# Patient Record
Sex: Female | Born: 1960 | Race: White | Hispanic: No | Marital: Married | Smoking: Never smoker
Health system: Southern US, Community
[De-identification: ages and names within clinical notes are randomized; demographics above are authoritative.]

## PROBLEM LIST (undated history)

## (undated) DIAGNOSIS — B009 Herpesviral infection, unspecified: Secondary | ICD-10-CM

## (undated) DIAGNOSIS — B379 Candidiasis, unspecified: Secondary | ICD-10-CM

## (undated) DIAGNOSIS — N952 Postmenopausal atrophic vaginitis: Secondary | ICD-10-CM

## (undated) DIAGNOSIS — R112 Nausea with vomiting, unspecified: Secondary | ICD-10-CM

## (undated) DIAGNOSIS — N941 Unspecified dyspareunia: Secondary | ICD-10-CM

## (undated) DIAGNOSIS — A749 Chlamydial infection, unspecified: Secondary | ICD-10-CM

## (undated) DIAGNOSIS — I10 Essential (primary) hypertension: Secondary | ICD-10-CM

## (undated) DIAGNOSIS — R9230 Dense breasts, unspecified: Secondary | ICD-10-CM

## (undated) DIAGNOSIS — A499 Bacterial infection, unspecified: Secondary | ICD-10-CM

## (undated) DIAGNOSIS — Z9889 Other specified postprocedural states: Secondary | ICD-10-CM

## (undated) DIAGNOSIS — N809 Endometriosis, unspecified: Secondary | ICD-10-CM

## (undated) DIAGNOSIS — R922 Inconclusive mammogram: Secondary | ICD-10-CM

## (undated) DIAGNOSIS — Z8619 Personal history of other infectious and parasitic diseases: Secondary | ICD-10-CM

## (undated) HISTORY — DX: Inconclusive mammogram: R92.2

## (undated) HISTORY — DX: Endometriosis, unspecified: N80.9

## (undated) HISTORY — PX: BREAST CYST ASPIRATION: SHX578

## (undated) HISTORY — PX: BREAST EXCISIONAL BIOPSY: SUR124

## (undated) HISTORY — DX: Herpesviral infection, unspecified: B00.9

## (undated) HISTORY — DX: Postmenopausal atrophic vaginitis: N95.2

## (undated) HISTORY — PX: LAPAROSCOPY: SHX197

## (undated) HISTORY — DX: Dense breasts, unspecified: R92.30

## (undated) HISTORY — DX: Candidiasis, unspecified: B37.9

## (undated) HISTORY — DX: Chlamydial infection, unspecified: A74.9

## (undated) HISTORY — PX: MYOMECTOMY: SHX85

## (undated) HISTORY — PX: ABDOMINAL HYSTERECTOMY: SHX81

## (undated) HISTORY — DX: Personal history of other infectious and parasitic diseases: Z86.19

## (undated) HISTORY — PX: VESICOVAGINAL FISTULA CLOSURE W/ TAH: SUR271

## (undated) HISTORY — DX: Essential (primary) hypertension: I10

## (undated) HISTORY — DX: Unspecified dyspareunia: N94.10

## (undated) HISTORY — PX: BREAST BIOPSY: SHX20

## (undated) HISTORY — PX: WISDOM TOOTH EXTRACTION: SHX21

## (undated) HISTORY — DX: Bacterial infection, unspecified: A49.9

## (undated) HISTORY — PX: TONSILECTOMY, ADENOIDECTOMY, BILATERAL MYRINGOTOMY AND TUBES: SHX2538

---

## 2005-04-19 ENCOUNTER — Ambulatory Visit (HOSPITAL_COMMUNITY): Admission: RE | Admit: 2005-04-19 | Discharge: 2005-04-19 | Payer: Self-pay | Admitting: Obstetrics and Gynecology

## 2005-11-28 ENCOUNTER — Encounter: Admission: RE | Admit: 2005-11-28 | Discharge: 2005-11-28 | Payer: Self-pay | Admitting: Obstetrics and Gynecology

## 2006-12-21 ENCOUNTER — Other Ambulatory Visit: Admission: RE | Admit: 2006-12-21 | Discharge: 2006-12-21 | Payer: Self-pay | Admitting: Obstetrics and Gynecology

## 2006-12-25 ENCOUNTER — Encounter: Admission: RE | Admit: 2006-12-25 | Discharge: 2006-12-25 | Payer: Self-pay | Admitting: Obstetrics and Gynecology

## 2006-12-26 ENCOUNTER — Encounter (INDEPENDENT_AMBULATORY_CARE_PROVIDER_SITE_OTHER): Payer: Self-pay | Admitting: Radiology

## 2006-12-26 ENCOUNTER — Encounter: Admission: RE | Admit: 2006-12-26 | Discharge: 2006-12-26 | Payer: Self-pay | Admitting: Obstetrics and Gynecology

## 2007-12-31 ENCOUNTER — Other Ambulatory Visit: Admission: RE | Admit: 2007-12-31 | Discharge: 2007-12-31 | Payer: Self-pay | Admitting: Obstetrics and Gynecology

## 2008-01-03 ENCOUNTER — Encounter: Admission: RE | Admit: 2008-01-03 | Discharge: 2008-01-03 | Payer: Self-pay | Admitting: Obstetrics and Gynecology

## 2008-12-31 ENCOUNTER — Other Ambulatory Visit: Admission: RE | Admit: 2008-12-31 | Discharge: 2008-12-31 | Payer: Self-pay | Admitting: Obstetrics and Gynecology

## 2009-04-09 ENCOUNTER — Encounter: Admission: RE | Admit: 2009-04-09 | Discharge: 2009-04-09 | Payer: Self-pay | Admitting: Obstetrics and Gynecology

## 2010-04-14 ENCOUNTER — Encounter: Admission: RE | Admit: 2010-04-14 | Discharge: 2010-04-14 | Payer: Self-pay | Admitting: Obstetrics and Gynecology

## 2011-03-23 ENCOUNTER — Other Ambulatory Visit: Payer: Self-pay | Admitting: Obstetrics and Gynecology

## 2011-03-23 DIAGNOSIS — Z1231 Encounter for screening mammogram for malignant neoplasm of breast: Secondary | ICD-10-CM

## 2011-04-20 ENCOUNTER — Ambulatory Visit
Admission: RE | Admit: 2011-04-20 | Discharge: 2011-04-20 | Disposition: A | Discharge status: Home / Self Care | Payer: BC Managed Care – PPO | Source: Ambulatory Visit | Attending: Obstetrics and Gynecology | Admitting: Obstetrics and Gynecology

## 2011-04-20 DIAGNOSIS — Z1231 Encounter for screening mammogram for malignant neoplasm of breast: Secondary | ICD-10-CM

## 2011-12-04 ENCOUNTER — Other Ambulatory Visit: Payer: Self-pay | Admitting: Obstetrics and Gynecology

## 2011-12-05 ENCOUNTER — Telehealth: Payer: Self-pay

## 2011-12-05 NOTE — Telephone Encounter (Signed)
Lm on vm to cb per rx request for Premarin received from pharm.

## 2011-12-05 NOTE — Telephone Encounter (Signed)
Tc from pt per telephone call. Pt needs rf for Premarin. Aex sched 02/08/12@2 :00 with [facility]. Rx e-pres to pharm on file. Pt agrees.

## 2012-02-02 DIAGNOSIS — I10 Essential (primary) hypertension: Secondary | ICD-10-CM

## 2012-02-02 DIAGNOSIS — A749 Chlamydial infection, unspecified: Secondary | ICD-10-CM

## 2012-02-02 DIAGNOSIS — A499 Bacterial infection, unspecified: Secondary | ICD-10-CM | POA: Insufficient documentation

## 2012-02-02 DIAGNOSIS — B379 Candidiasis, unspecified: Secondary | ICD-10-CM

## 2012-02-02 DIAGNOSIS — N809 Endometriosis, unspecified: Secondary | ICD-10-CM | POA: Insufficient documentation

## 2012-02-02 DIAGNOSIS — B009 Herpesviral infection, unspecified: Secondary | ICD-10-CM

## 2012-02-08 ENCOUNTER — Ambulatory Visit (INDEPENDENT_AMBULATORY_CARE_PROVIDER_SITE_OTHER): Payer: BC Managed Care – PPO | Admitting: Obstetrics and Gynecology

## 2012-02-08 ENCOUNTER — Encounter: Payer: Self-pay | Admitting: Obstetrics and Gynecology

## 2012-02-08 VITALS — BP 126/70 | Ht 67.25 in | Wt 143.0 lb

## 2012-02-08 DIAGNOSIS — Z01419 Encounter for gynecological examination (general) (routine) without abnormal findings: Secondary | ICD-10-CM

## 2012-02-08 DIAGNOSIS — Z2089 Contact with and (suspected) exposure to other communicable diseases: Secondary | ICD-10-CM

## 2012-02-08 DIAGNOSIS — Z202 Contact with and (suspected) exposure to infections with a predominantly sexual mode of transmission: Secondary | ICD-10-CM

## 2012-02-08 MED ORDER — ESTROGENS CONJUGATED 0.625 MG PO TABS: ORAL_TABLET | ORAL | Status: DC

## 2012-02-08 NOTE — Progress Notes (Signed)
Subjective:  AEX:  Last Pap: 2011 WNL: Yes Regular Periods:no Contraception: Hysterectomy  Monthly Breast exam:yes Tetanus<81yrs:yes Nl.Bladder Function:yes Daily BMs:yes Healthy Diet:yes Calcium:yes Mammogram:yes Date of Mammogram: 04/2011 Exercise:yes Have often Exercise: 3-4 times weekly Seatbelt: yes Abuse at home: no Stressful work:yes Sigmoid-colonoscopy: none Bone Density: No PCP: Dr. Nehemiah Settle Change in PMH: None Change in [facility]: None    Madeline Morris is a 51 y.o. female G1P0010 who presents for annual exam.  The patient has no complaints today. Menopausal symptoms are well managed with Premarin and she wants to continue  The following portions of the patient's history were reviewed and updated as appropriate: allergies, current medications, past family history, past medical history, past social history, past surgical history and problem list.  Review of Systems Pertinent items are noted in HPI. Gastrointestinal:No change in bowel habits, no abdominal pain, no rectal bleeding Genitourinary:negative for dysuria, frequency, hematuria, nocturia and urinary incontinence    Objective:     BP 126/70  Ht 5' 7.25" (1.708 m)  Wt 143 lb (64.864 kg)  BMI 22.23 kg/m2  Weight:  Wt Readings from Last 1 Encounters:  02/08/12 143 lb (64.864 kg)     BMI: Body mass index is 22.23 kg/(m^2). General Appearance: Alert, appropriate appearance for age. No acute distress HEENT: Grossly normal Neck / Thyroid: Supple, no masses, nodes or enlargement Lungs: clear to auscultation bilaterally Back: No CVA tenderness Breast Exam: No masses or nodes.No dimpling, nipple retraction or discharge. Cardiovascular: Regular rate and rhythm. S1, S2, no murmur Gastrointestinal: Soft, non-tender, no masses or organomegaly Pelvic Exam: External genitalia: normal general appearance Vaginal: normal rugae and vaginal vault, well healed and well suspended Cervix: removed surgically Adnexa: non  palpable Uterus: removed surgically Rectovaginal: normal rectal, no masses Lymphatic Exam: Non-palpable nodes in neck, clavicular, axillary, or inguinal regions Skin: no rash or abnormalities Neurologic: Normal gait and speech, no tremor  Psychiatric: Alert and oriented, appropriate affect.    Urinalysis:Not done    Assessment:    Normal gyn exam post-hysterectomy   Plan:   mammogram return annually or prn Hepatitis C as recommended by [facility]

## 2012-03-19 ENCOUNTER — Other Ambulatory Visit: Payer: Self-pay | Admitting: Obstetrics and Gynecology

## 2012-03-19 DIAGNOSIS — Z1231 Encounter for screening mammogram for malignant neoplasm of breast: Secondary | ICD-10-CM

## 2012-04-22 ENCOUNTER — Ambulatory Visit
Admission: RE | Admit: 2012-04-22 | Discharge: 2012-04-22 | Disposition: A | Discharge status: Home / Self Care | Payer: BC Managed Care – PPO | Source: Ambulatory Visit | Attending: Obstetrics and Gynecology | Admitting: Obstetrics and Gynecology

## 2012-04-22 DIAGNOSIS — Z1231 Encounter for screening mammogram for malignant neoplasm of breast: Secondary | ICD-10-CM

## 2012-04-30 ENCOUNTER — Encounter: Payer: Self-pay | Admitting: Obstetrics and Gynecology

## 2012-07-17 ENCOUNTER — Other Ambulatory Visit: Payer: Self-pay | Admitting: Dermatology

## 2012-09-21 ENCOUNTER — Ambulatory Visit (INDEPENDENT_AMBULATORY_CARE_PROVIDER_SITE_OTHER): Payer: BC Managed Care – PPO | Admitting: Physician Assistant

## 2012-09-21 VITALS — BP 174/94 | HR 91 | Temp 99.2°F | Resp 16 | Ht 67.5 in | Wt 141.8 lb

## 2012-09-21 DIAGNOSIS — R509 Fever, unspecified: Secondary | ICD-10-CM

## 2012-09-21 DIAGNOSIS — J029 Acute pharyngitis, unspecified: Secondary | ICD-10-CM

## 2012-09-21 DIAGNOSIS — I10 Essential (primary) hypertension: Secondary | ICD-10-CM

## 2012-09-21 LAB — POCT CBC
Granulocyte percent: 72 %G (ref 37–80)
HCT, POC: 42.4 % (ref 37.7–47.9)
Hemoglobin: 13.5 g/dL (ref 12.2–16.2)
Lymph, poc: 2 (ref 0.6–3.4)
MCH, POC: 29 pg (ref 27–31.2)
MCHC: 31.8 g/dL (ref 31.8–35.4)
MCV: 91.1 fL (ref 80–97)
MID (cbc): 0.6 (ref 0–0.9)
MPV: 8.9 fL (ref 0–99.8)
POC Granulocyte: 6.8 (ref 2–6.9)
POC LYMPH PERCENT: 21.3 %L (ref 10–50)
POC MID %: 6.7 %M (ref 0–12)
Platelet Count, POC: 294 10*3/uL (ref 142–424)
RDW, POC: 12.3 %
WBC: 9.4 10*3/uL (ref 4.6–10.2)

## 2012-09-21 LAB — POCT RAPID STREP A (OFFICE): Rapid Strep A Screen: NEGATIVE

## 2012-09-21 MED ORDER — PENICILLIN V POTASSIUM 500 MG PO TABS: 500.0000 mg | ORAL_TABLET | Freq: Three times a day (TID) | ORAL | Status: DC

## 2012-09-21 MED ORDER — PROMETHAZINE-DM 6.25-15 MG/5ML PO SYRP: 5.0000 mL | ORAL_SOLUTION | Freq: Four times a day (QID) | ORAL | Status: DC | PRN

## 2012-09-21 NOTE — Progress Notes (Signed)
Subjective:    Patient ID: Madeline Morris, female    DOB: Aug 26, 1960, 52 y.o.   MRN: [id number]  HPI 52 yr old CF presents with sore throat for 5 days.  She has also had fever and over the last couple of days she has had cough and hasnt been able to sleep.  No N/V/D.  Cough has not been productive. She went to an urgent care in [place](that's where she was when she got sick) and they did a quick test for strep that was negative. She has taken some ibuprofen which helps temporarily. +body aches. She hasn't taken her BP meds regularly over the last few days. shes never had mono or CMV that she knows of.  Review of Systems  Constitutional: Positive for chills and fatigue.  HENT: Positive for postnasal drip. Negative for congestion, rhinorrhea and sneezing.   Neurological: Positive for headaches.  All other systems reviewed and are negative.       Objective:   Physical Exam  Nursing note and vitals reviewed. Constitutional: She is oriented to person, place, and time. She appears well-developed and well-nourished.  HENT:  Head: Normocephalic and atraumatic.  Right Ear: External ear normal.  Left Ear: External ear normal.  Nose: Nose normal.  Mouth/Throat: No oropharyngeal exudate.  Throat with erythema, tonsils absent  Cardiovascular: Normal rate, regular rhythm and normal heart sounds.   Pulmonary/Chest: Effort normal and breath sounds normal.  Lymphadenopathy:    She has cervical adenopathy (shotty AC node on R, proximally).  Neurological: She is alert and oriented to person, place, and time.  Skin: Skin is warm and dry.  Psychiatric: She has a normal mood and affect. Her behavior is normal.   Results for orders placed in visit on 09/21/12  POCT CBC      Result Value Range   WBC 9.4  4.6 - 10.2 K/uL   Lymph, poc 2.0  0.6 - 3.4   POC LYMPH PERCENT 21.3  10 - 50 %L   MID (cbc) 0.6  0 - 0.9   POC MID % 6.7  0 - 12 %M   POC Granulocyte 6.8  2 - 6.9   Granulocyte percent 72.0  37  - 80 %G   RBC 4.65  4.04 - 5.48 M/uL   Hemoglobin 13.5  12.2 - 16.2 g/dL   HCT, POC 40.9  81.1 - 47.9 %   MCV 91.1  80 - 97 fL   MCH, POC 29.0  27 - 31.2 pg   MCHC 31.8  31.8 - 35.4 g/dL   RDW, POC 91.4     Platelet Count, POC 294  142 - 424 K/uL   MPV 8.9  0 - 99.8 fL  POCT RAPID STREP A (OFFICE)      Result Value Range   Rapid Strep A Screen Negative  Negative        Assessment & Plan:  Pharyngitis and cough at night-suspicious for strep and will cover for strep until culture returns.  Salt  water gargles, ibuprofen, fluids and rest. Meds ordered this encounter  Medications  . penicillin v potassium (VEETID) 500 MG tablet    Sig: Take 1 tablet (500 mg total) by mouth 3 (three) times daily.    Dispense:  30 tablet    Refill:  0    Order Specific Question:  Supervising Provider    Answer:  DOOLITTLE, ROBERT P [[id number]]  . promethazine-dextromethorphan (PROMETHAZINE-DM) 6.25-15 MG/5ML syrup    Sig: Take 5 mLs by  mouth 4 (four) times daily as needed for cough.    Dispense:  118 mL    Refill:  0    Order Specific Question:  Supervising Provider    Answer:  DOOLITTLE, ROBERT P [[id number]]   Hypertension-take meds regularly and check BP OOO.  RTC if it doesn't go back to normal over the next few days.

## 2012-09-21 NOTE — Patient Instructions (Addendum)
Return to clinic if not improving in 3-5 days, sooner if worse.

## 2012-09-23 LAB — CULTURE, GROUP A STREP: Organism ID, Bacteria: NORMAL

## 2013-04-30 ENCOUNTER — Other Ambulatory Visit: Payer: Self-pay

## 2013-04-30 DIAGNOSIS — Z1231 Encounter for screening mammogram for malignant neoplasm of breast: Secondary | ICD-10-CM

## 2013-05-29 ENCOUNTER — Ambulatory Visit
Admission: RE | Admit: 2013-05-29 | Discharge: 2013-05-29 | Disposition: A | Discharge status: Home / Self Care | Payer: Self-pay | Source: Ambulatory Visit

## 2013-05-29 DIAGNOSIS — Z1231 Encounter for screening mammogram for malignant neoplasm of breast: Secondary | ICD-10-CM

## 2013-10-02 ENCOUNTER — Other Ambulatory Visit: Payer: Self-pay | Admitting: Internal Medicine

## 2013-10-02 DIAGNOSIS — R4182 Altered mental status, unspecified: Secondary | ICD-10-CM

## 2013-10-07 ENCOUNTER — Ambulatory Visit
Admission: RE | Admit: 2013-10-07 | Discharge: 2013-10-07 | Disposition: A | Discharge status: Home / Self Care | Payer: BC Managed Care – PPO | Source: Ambulatory Visit | Attending: Internal Medicine | Admitting: Internal Medicine

## 2013-10-07 DIAGNOSIS — R4182 Altered mental status, unspecified: Secondary | ICD-10-CM

## 2014-08-19 ENCOUNTER — Other Ambulatory Visit: Payer: Self-pay

## 2014-08-19 DIAGNOSIS — Z1231 Encounter for screening mammogram for malignant neoplasm of breast: Secondary | ICD-10-CM

## 2014-08-27 ENCOUNTER — Ambulatory Visit
Admission: RE | Admit: 2014-08-27 | Discharge: 2014-08-27 | Disposition: A | Discharge status: Home / Self Care | Payer: BLUE CROSS/BLUE SHIELD | Source: Ambulatory Visit

## 2014-08-27 DIAGNOSIS — Z1231 Encounter for screening mammogram for malignant neoplasm of breast: Secondary | ICD-10-CM

## 2015-07-21 ENCOUNTER — Other Ambulatory Visit: Payer: Self-pay

## 2015-07-21 DIAGNOSIS — Z1231 Encounter for screening mammogram for malignant neoplasm of breast: Secondary | ICD-10-CM

## 2015-08-30 ENCOUNTER — Ambulatory Visit
Admission: RE | Admit: 2015-08-30 | Discharge: 2015-08-30 | Disposition: A | Discharge status: Home / Self Care | Payer: BLUE CROSS/BLUE SHIELD | Source: Ambulatory Visit

## 2015-08-30 DIAGNOSIS — Z1231 Encounter for screening mammogram for malignant neoplasm of breast: Secondary | ICD-10-CM

## 2016-03-29 ENCOUNTER — Ambulatory Visit (INDEPENDENT_AMBULATORY_CARE_PROVIDER_SITE_OTHER): Payer: BLUE CROSS/BLUE SHIELD | Admitting: Podiatry

## 2016-03-29 ENCOUNTER — Ambulatory Visit (INDEPENDENT_AMBULATORY_CARE_PROVIDER_SITE_OTHER): Payer: BLUE CROSS/BLUE SHIELD

## 2016-03-29 ENCOUNTER — Encounter: Payer: Self-pay | Admitting: Podiatry

## 2016-03-29 VITALS — Resp 16 | Ht 67.0 in | Wt 148.0 lb

## 2016-03-29 DIAGNOSIS — M79671 Pain in right foot: Secondary | ICD-10-CM

## 2016-03-29 DIAGNOSIS — M79672 Pain in left foot: Secondary | ICD-10-CM | POA: Diagnosis not present

## 2016-03-29 DIAGNOSIS — M2042 Other hammer toe(s) (acquired), left foot: Secondary | ICD-10-CM | POA: Diagnosis not present

## 2016-03-29 DIAGNOSIS — M21629 Bunionette of unspecified foot: Secondary | ICD-10-CM | POA: Diagnosis not present

## 2016-03-29 DIAGNOSIS — M2041 Other hammer toe(s) (acquired), right foot: Secondary | ICD-10-CM | POA: Diagnosis not present

## 2016-03-29 DIAGNOSIS — M7750 Other enthesopathy of unspecified foot: Secondary | ICD-10-CM | POA: Diagnosis not present

## 2016-03-29 DIAGNOSIS — M2010 Hallux valgus (acquired), unspecified foot: Secondary | ICD-10-CM

## 2016-03-29 MED ORDER — NONFORMULARY OR COMPOUNDED ITEM: 1.0000 g | Freq: Four times a day (QID) | 2 refills | Status: DC

## 2016-03-29 NOTE — Progress Notes (Signed)
   Subjective:    Patient ID: Madeline Morris, female    DOB: 06/11/1961, 55 y.o.   MRN: [id number]  HPI    Review of Systems  All other systems reviewed and are negative.      Objective:   Physical Exam        Assessment & Plan:

## 2016-04-02 NOTE — Progress Notes (Signed)
Patient ID: Madeline Morris, female   DOB: 03-24-1961, 55 y.o.   MRN: [id number] Subjective:  Patient presents today with bilateral foot pain. Patient states that she states is throbbing all over the bottom of her foot this morning when she woke. Patient is a runner and states that she has experimented with forefoot strike running at approximately the same time she experienced pain. Patient presents today for further treatment and evaluation.    Objective/Physical Exam General: The patient is alert and oriented x3 in no acute distress.  Dermatology: Skin is warm, dry and supple bilateral lower extremities. Negative for open lesions or macerations.  Vascular: Palpable pedal pulses bilaterally. No edema or erythema noted. Capillary refill within normal limits.  Neurological: Epicritic and protective threshold grossly intact bilaterally.   Musculoskeletal Exam: Generalized foot pain throughout the bilateral lower extremities likely secondary to forefoot running. Range of motion within normal limits to all pedal and ankle joints bilateral. Muscle strength 5/5 in all groups bilateral.   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 Dorsal spurring noted on lateral x-rays bilaterally.    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 X-rays discussed #2 discussed conservative versus surgical management of all mentioned deformities. At this time the patient opts for conservative management. #3 recommend rest ice compression and elevation to the bilateral feet 4 alleviation and improvement of symptoms. #4  patient is to return to clinic when necessary   Dr. Felecia ShellingBrent M. Lonisha Bobby, DPM [facility]

## 2016-04-11 ENCOUNTER — Ambulatory Visit: Payer: BLUE CROSS/BLUE SHIELD | Admitting: Podiatry

## 2016-05-15 DIAGNOSIS — I10 Essential (primary) hypertension: Secondary | ICD-10-CM | POA: Diagnosis not present

## 2016-06-13 DIAGNOSIS — I10 Essential (primary) hypertension: Secondary | ICD-10-CM | POA: Diagnosis not present

## 2016-07-25 ENCOUNTER — Other Ambulatory Visit: Payer: Self-pay | Admitting: Obstetrics and Gynecology

## 2016-07-25 DIAGNOSIS — Z1231 Encounter for screening mammogram for malignant neoplasm of breast: Secondary | ICD-10-CM

## 2016-08-31 ENCOUNTER — Ambulatory Visit
Admission: RE | Admit: 2016-08-31 | Discharge: 2016-08-31 | Disposition: A | Discharge status: Home / Self Care | Payer: BLUE CROSS/BLUE SHIELD | Source: Ambulatory Visit | Attending: Obstetrics and Gynecology | Admitting: Obstetrics and Gynecology

## 2016-08-31 DIAGNOSIS — Z1231 Encounter for screening mammogram for malignant neoplasm of breast: Secondary | ICD-10-CM

## 2016-09-04 DIAGNOSIS — N952 Postmenopausal atrophic vaginitis: Secondary | ICD-10-CM | POA: Diagnosis not present

## 2016-09-04 DIAGNOSIS — Z01411 Encounter for gynecological examination (general) (routine) with abnormal findings: Secondary | ICD-10-CM | POA: Diagnosis not present

## 2016-09-04 DIAGNOSIS — N941 Unspecified dyspareunia: Secondary | ICD-10-CM | POA: Diagnosis not present

## 2016-09-27 DIAGNOSIS — M6281 Muscle weakness (generalized): Secondary | ICD-10-CM | POA: Diagnosis not present

## 2016-09-27 DIAGNOSIS — N941 Unspecified dyspareunia: Secondary | ICD-10-CM | POA: Diagnosis not present

## 2016-09-27 DIAGNOSIS — M62838 Other muscle spasm: Secondary | ICD-10-CM | POA: Diagnosis not present

## 2016-09-27 DIAGNOSIS — N942 Vaginismus: Secondary | ICD-10-CM | POA: Diagnosis not present

## 2016-10-02 DIAGNOSIS — N942 Vaginismus: Secondary | ICD-10-CM | POA: Diagnosis not present

## 2016-10-02 DIAGNOSIS — M6281 Muscle weakness (generalized): Secondary | ICD-10-CM | POA: Diagnosis not present

## 2016-10-02 DIAGNOSIS — N941 Unspecified dyspareunia: Secondary | ICD-10-CM | POA: Diagnosis not present

## 2016-10-02 DIAGNOSIS — M62838 Other muscle spasm: Secondary | ICD-10-CM | POA: Diagnosis not present

## 2016-11-01 DIAGNOSIS — M6281 Muscle weakness (generalized): Secondary | ICD-10-CM | POA: Diagnosis not present

## 2016-11-01 DIAGNOSIS — N942 Vaginismus: Secondary | ICD-10-CM | POA: Diagnosis not present

## 2016-11-01 DIAGNOSIS — N3946 Mixed incontinence: Secondary | ICD-10-CM | POA: Diagnosis not present

## 2016-11-01 DIAGNOSIS — M62838 Other muscle spasm: Secondary | ICD-10-CM | POA: Diagnosis not present

## 2016-11-15 DIAGNOSIS — N941 Unspecified dyspareunia: Secondary | ICD-10-CM | POA: Diagnosis not present

## 2016-11-15 DIAGNOSIS — I10 Essential (primary) hypertension: Secondary | ICD-10-CM | POA: Diagnosis not present

## 2016-11-15 DIAGNOSIS — N952 Postmenopausal atrophic vaginitis: Secondary | ICD-10-CM | POA: Diagnosis not present

## 2016-11-23 ENCOUNTER — Telehealth: Payer: Self-pay | Admitting: Cardiovascular Disease

## 2016-11-23 NOTE — Telephone Encounter (Signed)
Received Records from [facility] for appointment on 12/19/16 with Dr Duke Salviaandolph.  Records put with Dr Leonides Sakeandolph's schedule for 12/19/16. lp

## 2016-12-19 ENCOUNTER — Ambulatory Visit (INDEPENDENT_AMBULATORY_CARE_PROVIDER_SITE_OTHER): Payer: BLUE CROSS/BLUE SHIELD | Admitting: Cardiovascular Disease

## 2016-12-19 ENCOUNTER — Encounter: Payer: Self-pay | Admitting: Cardiovascular Disease

## 2016-12-19 VITALS — BP 154/86 | HR 61 | Ht 67.0 in | Wt 137.0 lb

## 2016-12-19 DIAGNOSIS — Z79899 Other long term (current) drug therapy: Secondary | ICD-10-CM

## 2016-12-19 DIAGNOSIS — R0789 Other chest pain: Secondary | ICD-10-CM

## 2016-12-19 DIAGNOSIS — E7800 Pure hypercholesterolemia, unspecified: Secondary | ICD-10-CM

## 2016-12-19 DIAGNOSIS — R0609 Other forms of dyspnea: Secondary | ICD-10-CM | POA: Diagnosis not present

## 2016-12-19 MED ORDER — LOSARTAN POTASSIUM 100 MG PO TABS: 100.0000 mg | ORAL_TABLET | Freq: Every day | ORAL | 5 refills | Status: DC

## 2016-12-19 NOTE — Progress Notes (Signed)
Cardiology Office Note   Date:  12/19/2016   ID:  Madeline Morris, DOB 1961/03/06, MRN [id number]  PCP:  System, Pcp Not In  Cardiologist:   Chilton Si, MD   Chief Complaint  Patient presents with  . New Patient (Initial Visit)      History of Present Illness: Madeline Morris is a 56 y.o. female with hypertension who is being seen today for the evaluation of hypertension at the request of Dr. Dierdre Forth.  Madeline Morris Reports that she's had hypertension for at least 25 years. Her blood pressure was elevated for at least 5 years prior to that but she was not started on any medication. Overall she has been feeling well. She runs 2-3 miles most days of the week. She also exercises on a Peloton bike and lifts weights. She doesn't have any chest pain but does feel short of breath with exertion. She feels as though her endurance is not increasing as it should for the amount that she exercises.  She checks her blood pressure at home and it typically is in the 130s to 150s.  Dr. Gaynelle Cage recently started her on losartan, which has helped. Prior to that she also noted increased lower extremity edema. This has been better since adding losartan. However her blood pressure remains poorly-controlled.  She denies orthopnea or PND.  She saw Dr. Pennie Rushing and reported this dyspareunia.  She is interested in starting hormone replacement therapy. She was to cardiology for risk assessment prior to starting this medication and for blood pressure management. Madeline Morris had carotid Dopplers 09/2013 that showed mild bilateral intimal thickening but no plaque.   Past Medical History:  Diagnosis Date  . Atrophy of vagina   . Bacterial infection   . Chlamydia   . Dense breast   . Dyspareunia in female   . Endometriosis   . Herpes   . Herpes simplex   . High blood pressure   . History of chicken pox   . History of measles   . History of mumps   . Hypertensive disorder   . Yeast infection      Past Surgical History:  Procedure Laterality Date  . ABDOMINAL HYSTERECTOMY    . BREAST BIOPSY    . BREAST CYST ASPIRATION    . LAPAROSCOPY    . MYOMECTOMY    . TONSILECTOMY, ADENOIDECTOMY, BILATERAL MYRINGOTOMY AND TUBES    . VESICOVAGINAL FISTULA CLOSURE W/ TAH    . WISDOM TOOTH EXTRACTION       Current Outpatient Prescriptions  Medication Sig Dispense Refill  . Calcium Carb-Cholecalciferol (CALCIUM-VITAMIN D) 500-200 MG-UNIT tablet Take 1 tablet by mouth daily.    . Cholecalciferol (VITAMIN D PO) Take 2,000 Units by mouth daily.     Marland Kitchen losartan (COZAAR) 100 MG tablet Take 1 tablet (100 mg total) by mouth daily. 30 tablet 5  . Multiple Vitamin (MULTIVITAMIN) tablet Take 1 tablet by mouth daily.    . NONFORMULARY OR COMPOUNDED ITEM Apply 1-2 g topically 4 (four) times daily. 120 each 2  . Prasterone (INTRAROSA) 6.5 MG INST Place 1 application vaginally daily.    Marland Kitchen triamterene-hydrochlorothiazide (MAXZIDE-25) 37.5-25 MG tablet Take 1 tablet by mouth daily.      No current facility-administered medications for this visit.     Allergies:   Patient has no known allergies.    Social History:  The patient  reports that she has never smoked. She has never used smokeless tobacco. She reports that  she drinks alcohol. She reports that she does not use drugs.   Family History:  The patient's family history includes Cancer in her father, mother, paternal grandfather, and paternal grandmother; Heart attack in her maternal grandmother; Heart disease in her maternal grandmother; Hypertension in her father and mother.    ROS:  Please see the history of present illness.   Otherwise, review of systems are positive for none.   All other systems are reviewed and negative.    PHYSICAL EXAM: VS:  BP (!) 154/86   Pulse 61   Ht 5\' 7"  (1.702 m)   Wt 62.1 kg (137 lb)   BMI 21.46 kg/m  , BMI Body mass index is 21.46 kg/m. GENERAL:  Well appearing HEENT:  Pupils equal round and reactive,  fundi not visualized, oral mucosa unremarkable NECK:  No jugular venous distention, waveform within normal limits, carotid upstroke brisk and symmetric, no bruits, no thyromegaly LYMPHATICS:  No cervical adenopathy LUNGS:  Clear to auscultation bilaterally HEART:  RRR.  PMI not displaced or sustained,S1 and S2 within normal limits, no S3, no S4, no clicks, no rubs, no murmurs ABD:  Flat, positive bowel sounds normal in frequency in pitch, no bruits, no rebound, no guarding, no midline pulsatile mass, no hepatomegaly, no splenomegaly EXT:  2 plus pulses throughout, no edema, no cyanosis no clubbing SKIN:  No rashes no nodules NEURO:  Cranial nerves II through XII grossly intact, motor grossly intact throughout PSYCH:  Cognitively intact, oriented to person place and time    EKG:  EKG is ordered today. The ekg ordered today demonstrates sinus rhythm. Rate 61 bpm.   Recent Labs: No results found for requested labs within last 8760 hours.   02/11/16: Total cholesterol 243, triglycerides 60, HDL 95, LDL 136 Sodium 139, potassium 3.9, BUN 13, creatinine 0.73 AST 14, ALT 17  Lipid Panel No results found for: CHOL, TRIG, HDL, CHOLHDL, VLDL, LDLCALC, LDLDIRECT    Wt Readings from Last 3 Encounters:  12/19/16 62.1 kg (137 lb)  03/29/16 67.1 kg (148 lb)  09/21/12 64.3 kg (141 lb 12.8 oz)      ASSESSMENT AND PLAN:  # Hypertension:  Madeline Morris's BP remains elevated both here and at home.  We will increase losartan to 100 mg daily.  Continue HCTZ-triamterene.    # Exertional dyspnea:  Madeline Morris is very physically active and exercises regularly.  She doesn't have chest pain but feels that she gets more tired than she should given how frequently she works out.  We will get an ETT to assess for ischemia.   # HRT: Madeline Morris does not have any known CAD and hasn't experienced a PE/DVTor stroke.  Therefore, she should be at acceptable risk for HRT.  Will await results of ETT.   #  Hyperlipidemia: ASCVD 10 year risk 2.9%.  Therefore we will not start a statin or aspirin at this time.    Current medicines are reviewed at length with the patient today.  The patient does not have concerns regarding medicines.  The following changes have been made:  Increase losartan to 100 mg daily  Labs/ tests ordered today include:   Orders Placed This Encounter  Procedures  . Basic metabolic panel  . Exercise Tolerance Test  . EKG 12-Lead     Disposition:   FU with Madeline Morris C. Duke Salviaandolph, MD, [facility] in 2-4 weeks     This note was written with the assistance of speech recognition software.  Please excuse any transcriptional  errors.  Signed, Tyara Dassow C. Duke Salvia, MD, [facility]  12/19/2016 1:30 PM    [facility]

## 2016-12-19 NOTE — Patient Instructions (Signed)
Medication Instructions:  INCREASE YOUR LOSARTAN TO 100 MG DAILY   Labwork: BMET PRIOR TO YOUR FOLLOW UP  Testing/Procedures: Your physician has requested that you have an exercise tolerance test. For further information please visit [email or web address]. Please also follow instruction sheet, as given.  Follow-Up: Your physician recommends that you schedule a follow-up appointment in: 2-4 WEEKS  If you need a refill on your cardiac medications before your next appointment, please call your pharmacy.

## 2017-01-02 ENCOUNTER — Telehealth (HOSPITAL_COMMUNITY): Payer: Self-pay

## 2017-01-02 NOTE — Telephone Encounter (Signed)
Encounter complete. 

## 2017-01-04 ENCOUNTER — Ambulatory Visit (HOSPITAL_COMMUNITY)
Admission: RE | Admit: 2017-01-04 | Discharge: 2017-01-04 | Disposition: A | Discharge status: Home / Self Care | Payer: BLUE CROSS/BLUE SHIELD | Source: Ambulatory Visit | Attending: Cardiology | Admitting: Cardiology

## 2017-01-04 DIAGNOSIS — R0789 Other chest pain: Secondary | ICD-10-CM | POA: Diagnosis not present

## 2017-01-04 DIAGNOSIS — Z79899 Other long term (current) drug therapy: Secondary | ICD-10-CM | POA: Diagnosis not present

## 2017-01-04 LAB — EXERCISE TOLERANCE TEST
CSEPED: 10 min
Estimated workload: 11.7 METS
Exercise duration (sec): 0 s
MPHR: 164 {beats}/min
Peak HR: 184 {beats}/min
Percent HR: 112 %
RPE: 16
Rest HR: 75 {beats}/min

## 2017-01-04 LAB — BASIC METABOLIC PANEL
BUN/Creatinine Ratio: 22 (ref 9–23)
BUN: 16 mg/dL (ref 6–24)
CALCIUM: 10.1 mg/dL (ref 8.7–10.2)
CO2: 22 mmol/L (ref 20–29)
Chloride: 98 mmol/L (ref 96–106)
Creatinine, Ser: 0.74 mg/dL (ref 0.57–1.00)
GFR, EST AFRICAN AMERICAN: 105 mL/min/{1.73_m2} (ref >59)
GFR, EST NON AFRICAN AMERICAN: 91 mL/min/{1.73_m2} (ref >59)
Glucose: 95 mg/dL (ref 65–99)
Potassium: 4.3 mmol/L (ref 3.5–5.2)
Sodium: 136 mmol/L (ref 134–144)

## 2017-01-05 ENCOUNTER — Telehealth: Payer: Self-pay | Admitting: *Deleted

## 2017-01-05 NOTE — Telephone Encounter (Signed)
-----   Message from Yvetta CoderPamela A Phillips sent at 01/05/2017  8:11 AM EDT ----- Regarding: Abnormal ETT Abnormal ETT read by Dr Royann Shiversroitoru on 07.26.18. This patient is a pt of Dr Duke Salviaandolph.

## 2017-01-05 NOTE — Telephone Encounter (Signed)
Reviewed by DOD Dr. Allyson SabalBerry, no orders or recommendations at this time, follow up with Dr. Duke Salviaandolph.    Patient has upcoming appt Wednesday 8/1 with Dr. Duke Salviaandolph.

## 2017-01-08 ENCOUNTER — Telehealth: Payer: Self-pay | Admitting: *Deleted

## 2017-01-08 DIAGNOSIS — R9439 Abnormal result of other cardiovascular function study: Secondary | ICD-10-CM

## 2017-01-08 DIAGNOSIS — R0789 Other chest pain: Secondary | ICD-10-CM

## 2017-01-08 NOTE — Telephone Encounter (Signed)
-----   Message from Chilton Siiffany Sharon, MD sent at 01/08/2017  8:17 AM EDT ----- Stress test was slightly abnormal, but that was probably due to some baseline changes in the EKG.  Lets get a coronary CT-A.

## 2017-01-08 NOTE — Telephone Encounter (Signed)
Advised patient, [facility], and precert

## 2017-01-08 NOTE — Telephone Encounter (Signed)
-----   Message from Chilton Siiffany Sonoita, MD sent at 01/08/2017  8:16 AM EDT ----- Normal kidney function and electrolytes

## 2017-01-10 ENCOUNTER — Ambulatory Visit: Payer: BLUE CROSS/BLUE SHIELD | Admitting: Cardiovascular Disease

## 2017-01-16 ENCOUNTER — Encounter: Payer: Self-pay | Admitting: Cardiovascular Disease

## 2017-01-26 ENCOUNTER — Ambulatory Visit (HOSPITAL_COMMUNITY)
Admission: RE | Admit: 2017-01-26 | Discharge: 2017-01-26 | Disposition: A | Discharge status: Home / Self Care | Payer: BLUE CROSS/BLUE SHIELD | Source: Ambulatory Visit | Attending: Cardiovascular Disease | Admitting: Cardiovascular Disease

## 2017-01-26 ENCOUNTER — Encounter (HOSPITAL_COMMUNITY): Payer: Self-pay

## 2017-01-26 DIAGNOSIS — R0789 Other chest pain: Secondary | ICD-10-CM

## 2017-01-26 DIAGNOSIS — R9439 Abnormal result of other cardiovascular function study: Secondary | ICD-10-CM

## 2017-01-26 DIAGNOSIS — R079 Chest pain, unspecified: Secondary | ICD-10-CM | POA: Diagnosis not present

## 2017-01-26 MED ORDER — NITROGLYCERIN 0.4 MG SL SUBL
0.8000 mg | SUBLINGUAL_TABLET | Freq: Once | SUBLINGUAL | Status: AC
  Administered 2017-01-26: 0.8 mg via SUBLINGUAL

## 2017-01-26 MED ORDER — IOPAMIDOL (ISOVUE-370) INJECTION 76%
80.0000 mL | Freq: Once | INTRAVENOUS | Status: AC | PRN
  Administered 2017-01-26: 80 mL via INTRAVENOUS

## 2017-01-26 MED ORDER — IOPAMIDOL (ISOVUE-370) INJECTION 76%
INTRAVENOUS | Status: AC
  Filled 2017-01-26: qty 100

## 2017-01-26 MED ORDER — NITROGLYCERIN 0.4 MG SL SUBL
SUBLINGUAL_TABLET | SUBLINGUAL | Status: AC
  Filled 2017-01-26: qty 2

## 2017-01-26 MED ORDER — METOPROLOL TARTRATE 5 MG/5ML IV SOLN
INTRAVENOUS | Status: AC
  Administered 2017-01-26: 5 mg
  Filled 2017-01-26: qty 5

## 2017-01-26 MED ORDER — METOPROLOL TARTRATE 5 MG/5ML IV SOLN
INTRAVENOUS | Status: AC
  Filled 2017-01-26: qty 5

## 2017-01-26 MED ORDER — METOPROLOL TARTRATE 5 MG/5ML IV SOLN
5.0000 mg | INTRAVENOUS | Status: DC
  Administered 2017-01-26 (×2): 5 mg via INTRAVENOUS

## 2017-01-31 ENCOUNTER — Telehealth: Payer: Self-pay | Admitting: Cardiovascular Disease

## 2017-01-31 NOTE — Telephone Encounter (Signed)
-----   Message from Chilton Si, MD sent at 01/30/2017  4:26 PM EDT ----- Normal coronary arteries.  No blockages seen.  OK to take hormone replacement therapy.

## 2017-01-31 NOTE — Telephone Encounter (Signed)
Advised patient

## 2017-01-31 NOTE — Telephone Encounter (Signed)
Patient calling, states that she had a "CTA scan" on Friday and would like to know when she might receive results.

## 2017-02-05 DIAGNOSIS — N941 Unspecified dyspareunia: Secondary | ICD-10-CM | POA: Diagnosis not present

## 2017-02-05 DIAGNOSIS — N951 Menopausal and female climacteric states: Secondary | ICD-10-CM | POA: Diagnosis not present

## 2017-02-05 DIAGNOSIS — I10 Essential (primary) hypertension: Secondary | ICD-10-CM | POA: Diagnosis not present

## 2017-02-15 ENCOUNTER — Encounter: Payer: Self-pay | Admitting: Cardiovascular Disease

## 2017-02-15 ENCOUNTER — Ambulatory Visit (INDEPENDENT_AMBULATORY_CARE_PROVIDER_SITE_OTHER): Payer: BLUE CROSS/BLUE SHIELD | Admitting: Cardiovascular Disease

## 2017-02-15 VITALS — BP 130/84 | HR 82 | Ht 67.0 in | Wt 134.0 lb

## 2017-02-15 DIAGNOSIS — E7800 Pure hypercholesterolemia, unspecified: Secondary | ICD-10-CM | POA: Diagnosis not present

## 2017-02-15 DIAGNOSIS — I119 Hypertensive heart disease without heart failure: Secondary | ICD-10-CM

## 2017-02-15 DIAGNOSIS — I10 Essential (primary) hypertension: Secondary | ICD-10-CM

## 2017-02-15 MED ORDER — LOSARTAN POTASSIUM 100 MG PO TABS: 100.0000 mg | ORAL_TABLET | Freq: Every day | ORAL | 3 refills | Status: AC

## 2017-02-15 NOTE — Patient Instructions (Signed)

## 2017-02-15 NOTE — Progress Notes (Signed)
Cardiology Office Note   Date:  02/15/2017   ID:  Madeline Morris, DOB 1961/04/29, MRN [id number]  PCP:  Madeline DillsPolite, Ronald, MD  Cardiologist:   Madeline Siiffany Petersburg, MD   No chief complaint on file.    History of Present Illness: Madeline Morris is a 56 y.o. female with hypertension here for follow-up. She was initially seen 12/2016 for hypertension and risk assessment prior to starting HRT. At that appointment her blood pressure was poorly-controlled, so losartan was increased. She reported some exertional dyspnea and was referred for an ETT. She had some baseline EKG changes that limited the sensitivity and specificity of the test. However she did have some horizontal ST depressions.  She achieved 11.7 METs on a Bruce protocol.  She was referred for coronary CT-A that revealed a coronary calcium score of 0 and no coronary disease.  Madeline Morris had carotid Dopplers 09/2013 that showed mild bilateral intimal thickening but no plaque.    Since her last appointment Madeline Morris been doing well. She checks her blood pressure regularly at home and it Morris been in the 120s over 80s. She continues to exercise regularly and Morris no chest pain or shortness of breath. She denies lower extremity edema, orthopnea, or PND.  Past Medical History:  Diagnosis Date  . Atrophy of vagina   . Bacterial infection   . Chlamydia   . Dense breast   . Dyspareunia in female   . Endometriosis   . Herpes   . Herpes simplex   . High blood pressure   . History of chicken pox   . History of measles   . History of mumps   . Hypertensive disorder   . Yeast infection     Past Surgical History:  Procedure Laterality Date  . ABDOMINAL HYSTERECTOMY    . BREAST BIOPSY    . BREAST CYST ASPIRATION    . LAPAROSCOPY    . MYOMECTOMY    . TONSILECTOMY, ADENOIDECTOMY, BILATERAL MYRINGOTOMY AND TUBES    . VESICOVAGINAL FISTULA CLOSURE W/ TAH    . WISDOM TOOTH EXTRACTION       Current Outpatient Prescriptions    Medication Sig Dispense Refill  . Calcium Carb-Cholecalciferol (CALCIUM-VITAMIN D) 500-200 MG-UNIT tablet Take 1 tablet by mouth daily.    . Cholecalciferol (VITAMIN D PO) Take 2,000 Units by mouth daily.     Marland Kitchen. estradiol (ESTRACE) 1 MG tablet Take 1 tablet by mouth daily.    Marland Kitchen. losartan (COZAAR) 100 MG tablet Take 1 tablet (100 mg total) by mouth daily. 30 tablet 5  . Multiple Vitamin (MULTIVITAMIN) tablet Take 1 tablet by mouth daily.    . NONFORMULARY OR COMPOUNDED ITEM Apply 1-2 g topically 4 (four) times daily. 120 each 2  . Prasterone (INTRAROSA) 6.5 MG INST Place 1 application vaginally daily.    Marland Kitchen. triamterene-hydrochlorothiazide (MAXZIDE-25) 37.5-25 MG tablet Take 1 tablet by mouth daily.      No current facility-administered medications for this visit.     Allergies:   [name] no known allergies.    Social History:  The patient  reports that she Morris never smoked. She Morris never used smokeless tobacco. She reports that she drinks alcohol. She reports that she does not use drugs.   Family History:  The patient's family history includes Cancer in her father, mother, paternal grandfather, and paternal grandmother; Heart attack in her maternal grandmother; Heart disease in her maternal grandmother; Hypertension in her father and mother.  ROS:  Please see the history of present illness.   Otherwise, review of systems are positive for none.   All other systems are reviewed and negative.    PHYSICAL EXAM:  EKG:  EKG is ordered today. The ekg ordered today demonstrates sinus rhythm. Rate 61 bpm.  ETT 01/04/17:  Blood pressure demonstrated a hypertensive response to exercise.  Horizontal ST segment depression ST segment depression of 2 mm was noted during stress in the II, III, V5, V6 and aVF leads.  T wave inversion was noted during stress.   Baseline ECG shows LVH and prominent ST depression, much more obvious compared to previous ECG on file. There is no additional ST  segment depression during exercise. Good exercise capacity. Study specificity is limited by the presence of baseline ST segment abnormalities. Consider imaging studies if there is high suspicion for CAD.  Coronary CT-A 01/26/17: 1) Calcium score 0 2) Normal right dominant coronary arteries 3) Normal aortic root 3.5 cm  Recent Labs: 01/04/2017: BUN 16; Creatinine, Ser 0.74; Potassium 4.3; Sodium 136   02/11/16: Total cholesterol 243, triglycerides 60, HDL 95, LDL 136 Sodium 139, potassium 3.9, BUN 13, creatinine 0.73 AST 14, ALT 17  Lipid Panel No results found for: CHOL, TRIG, HDL, CHOLHDL, VLDL, LDLCALC, LDLDIRECT    Wt Readings from Last 3 Encounters:  02/15/17 60.8 kg (134 lb)  12/19/16 62.1 kg (137 lb)  03/29/16 67.1 kg (148 lb)    ASSESSMENT AND PLAN:  # Hypertension:  Madeline Morris is much better at home.  Continue losartan and HCTZ-triamterene.    # Exertional dyspnea:  ETT was abnormal but coronary CT-A was negative for CAD.  She was encouraged to continue her exercise routine.  This Morris improved since she stopped exercising in the hot weather.    # HRT: Low risk given no evidence of CAD.  # Hyperlipidemia: ASCVD 10 year risk 2.9%.  Therefore we will not start a statin or aspirin at this time.    Current medicines are reviewed at length with the patient today.  The patient does not have concerns regarding medicines.  The following changes have been made:  None  Labs/ tests ordered today include:   No orders of the defined types were placed in this encounter.    Disposition:   FU with Madeline Grays C. Duke Salvia, MD, [facility] in 1 year.    This note was written with the assistance of speech recognition software.  Please excuse any transcriptional errors.  Signed, Madeline Heyward C. Duke Salvia, MD, [facility]  02/15/2017 8:33 AM     [facility]

## 2017-03-22 DIAGNOSIS — Z23 Encounter for immunization: Secondary | ICD-10-CM | POA: Diagnosis not present

## 2017-03-22 DIAGNOSIS — M25561 Pain in right knee: Secondary | ICD-10-CM | POA: Diagnosis not present

## 2017-03-22 DIAGNOSIS — M25562 Pain in left knee: Secondary | ICD-10-CM | POA: Diagnosis not present

## 2017-07-20 DIAGNOSIS — L232 Allergic contact dermatitis due to cosmetics: Secondary | ICD-10-CM | POA: Diagnosis not present

## 2017-07-23 ENCOUNTER — Other Ambulatory Visit: Payer: Self-pay | Admitting: Obstetrics and Gynecology

## 2017-07-23 DIAGNOSIS — Z1231 Encounter for screening mammogram for malignant neoplasm of breast: Secondary | ICD-10-CM

## 2017-09-03 ENCOUNTER — Ambulatory Visit
Admission: RE | Admit: 2017-09-03 | Discharge: 2017-09-03 | Disposition: A | Discharge status: Home / Self Care | Payer: BLUE CROSS/BLUE SHIELD | Source: Ambulatory Visit | Attending: Obstetrics and Gynecology | Admitting: Obstetrics and Gynecology

## 2017-09-03 DIAGNOSIS — Z1231 Encounter for screening mammogram for malignant neoplasm of breast: Secondary | ICD-10-CM

## 2017-09-06 DIAGNOSIS — N941 Unspecified dyspareunia: Secondary | ICD-10-CM | POA: Diagnosis not present

## 2017-09-06 DIAGNOSIS — N952 Postmenopausal atrophic vaginitis: Secondary | ICD-10-CM | POA: Diagnosis not present

## 2017-09-06 DIAGNOSIS — E8941 Symptomatic postprocedural ovarian failure: Secondary | ICD-10-CM | POA: Diagnosis not present

## 2017-09-06 DIAGNOSIS — Z01419 Encounter for gynecological examination (general) (routine) without abnormal findings: Secondary | ICD-10-CM | POA: Diagnosis not present

## 2018-01-18 ENCOUNTER — Other Ambulatory Visit: Payer: Self-pay | Admitting: Cardiovascular Disease

## 2018-01-18 NOTE — Telephone Encounter (Signed)
Rx request sent to pharmacy.  

## 2018-01-31 DIAGNOSIS — I10 Essential (primary) hypertension: Secondary | ICD-10-CM | POA: Diagnosis not present

## 2018-01-31 DIAGNOSIS — Z1211 Encounter for screening for malignant neoplasm of colon: Secondary | ICD-10-CM | POA: Diagnosis not present

## 2018-02-18 ENCOUNTER — Other Ambulatory Visit: Payer: Self-pay | Admitting: Cardiovascular Disease

## 2018-02-19 DIAGNOSIS — Z1211 Encounter for screening for malignant neoplasm of colon: Secondary | ICD-10-CM | POA: Diagnosis not present

## 2018-02-19 DIAGNOSIS — R195 Other fecal abnormalities: Secondary | ICD-10-CM | POA: Diagnosis not present

## 2018-03-13 DIAGNOSIS — Z1211 Encounter for screening for malignant neoplasm of colon: Secondary | ICD-10-CM | POA: Diagnosis not present

## 2018-03-13 DIAGNOSIS — D122 Benign neoplasm of ascending colon: Secondary | ICD-10-CM | POA: Diagnosis not present

## 2018-03-13 DIAGNOSIS — D125 Benign neoplasm of sigmoid colon: Secondary | ICD-10-CM | POA: Diagnosis not present

## 2018-03-13 DIAGNOSIS — K635 Polyp of colon: Secondary | ICD-10-CM | POA: Diagnosis not present

## 2018-03-13 DIAGNOSIS — R195 Other fecal abnormalities: Secondary | ICD-10-CM | POA: Diagnosis not present

## 2018-03-13 DIAGNOSIS — D126 Benign neoplasm of colon, unspecified: Secondary | ICD-10-CM | POA: Diagnosis not present

## 2018-05-20 DIAGNOSIS — L989 Disorder of the skin and subcutaneous tissue, unspecified: Secondary | ICD-10-CM | POA: Diagnosis not present

## 2018-07-01 DIAGNOSIS — L578 Other skin changes due to chronic exposure to nonionizing radiation: Secondary | ICD-10-CM | POA: Diagnosis not present

## 2018-07-01 DIAGNOSIS — L570 Actinic keratosis: Secondary | ICD-10-CM | POA: Diagnosis not present

## 2018-07-01 DIAGNOSIS — L814 Other melanin hyperpigmentation: Secondary | ICD-10-CM | POA: Diagnosis not present

## 2018-07-31 ENCOUNTER — Other Ambulatory Visit: Payer: Self-pay | Admitting: Internal Medicine

## 2018-07-31 DIAGNOSIS — Z1231 Encounter for screening mammogram for malignant neoplasm of breast: Secondary | ICD-10-CM

## 2018-09-05 ENCOUNTER — Ambulatory Visit: Payer: BLUE CROSS/BLUE SHIELD

## 2018-10-02 ENCOUNTER — Ambulatory Visit: Payer: BLUE CROSS/BLUE SHIELD

## 2018-10-07 DIAGNOSIS — N951 Menopausal and female climacteric states: Secondary | ICD-10-CM | POA: Diagnosis not present

## 2018-10-07 DIAGNOSIS — Z01419 Encounter for gynecological examination (general) (routine) without abnormal findings: Secondary | ICD-10-CM | POA: Diagnosis not present

## 2018-10-07 DIAGNOSIS — Z6821 Body mass index (BMI) 21.0-21.9, adult: Secondary | ICD-10-CM | POA: Diagnosis not present

## 2018-10-07 DIAGNOSIS — N6459 Other signs and symptoms in breast: Secondary | ICD-10-CM | POA: Diagnosis not present

## 2018-10-09 ENCOUNTER — Other Ambulatory Visit: Payer: Self-pay | Admitting: Obstetrics and Gynecology

## 2018-10-09 DIAGNOSIS — N6320 Unspecified lump in the left breast, unspecified quadrant: Secondary | ICD-10-CM

## 2018-10-21 ENCOUNTER — Other Ambulatory Visit: Payer: Self-pay | Admitting: Obstetrics and Gynecology

## 2018-10-21 ENCOUNTER — Ambulatory Visit
Admission: RE | Admit: 2018-10-21 | Discharge: 2018-10-21 | Disposition: A | Discharge status: Home / Self Care | Payer: BLUE CROSS/BLUE SHIELD | Source: Ambulatory Visit | Attending: Obstetrics and Gynecology | Admitting: Obstetrics and Gynecology

## 2018-10-21 ENCOUNTER — Other Ambulatory Visit: Payer: Self-pay

## 2018-10-21 DIAGNOSIS — N6320 Unspecified lump in the left breast, unspecified quadrant: Secondary | ICD-10-CM

## 2018-10-25 ENCOUNTER — Other Ambulatory Visit: Payer: Self-pay

## 2018-10-25 ENCOUNTER — Ambulatory Visit
Admission: RE | Admit: 2018-10-25 | Discharge: 2018-10-25 | Disposition: A | Discharge status: Home / Self Care | Payer: BLUE CROSS/BLUE SHIELD | Source: Ambulatory Visit | Attending: Obstetrics and Gynecology | Admitting: Obstetrics and Gynecology

## 2018-10-25 DIAGNOSIS — N6092 Unspecified benign mammary dysplasia of left breast: Secondary | ICD-10-CM | POA: Diagnosis not present

## 2018-10-25 DIAGNOSIS — N6320 Unspecified lump in the left breast, unspecified quadrant: Secondary | ICD-10-CM

## 2018-10-25 DIAGNOSIS — N6321 Unspecified lump in the left breast, upper outer quadrant: Secondary | ICD-10-CM | POA: Diagnosis not present

## 2018-10-28 ENCOUNTER — Other Ambulatory Visit: Payer: Self-pay | Admitting: *Deleted

## 2018-10-28 ENCOUNTER — Other Ambulatory Visit: Payer: Self-pay | Admitting: Obstetrics and Gynecology

## 2018-10-28 DIAGNOSIS — N63 Unspecified lump in unspecified breast: Secondary | ICD-10-CM

## 2018-11-01 ENCOUNTER — Ambulatory Visit
Admission: RE | Admit: 2018-11-01 | Discharge: 2018-11-01 | Disposition: A | Discharge status: Home / Self Care | Payer: BLUE CROSS/BLUE SHIELD | Source: Ambulatory Visit | Attending: Obstetrics and Gynecology | Admitting: Obstetrics and Gynecology

## 2018-11-01 ENCOUNTER — Other Ambulatory Visit: Payer: Self-pay | Admitting: Obstetrics and Gynecology

## 2018-11-01 ENCOUNTER — Other Ambulatory Visit: Payer: Self-pay

## 2018-11-01 DIAGNOSIS — N63 Unspecified lump in unspecified breast: Secondary | ICD-10-CM

## 2018-11-01 DIAGNOSIS — N6321 Unspecified lump in the left breast, upper outer quadrant: Secondary | ICD-10-CM | POA: Diagnosis not present

## 2018-11-05 ENCOUNTER — Other Ambulatory Visit: Payer: BLUE CROSS/BLUE SHIELD

## 2018-11-07 ENCOUNTER — Other Ambulatory Visit: Payer: Self-pay | Admitting: General Surgery

## 2018-11-07 DIAGNOSIS — N6092 Unspecified benign mammary dysplasia of left breast: Secondary | ICD-10-CM | POA: Diagnosis not present

## 2018-11-07 DIAGNOSIS — I10 Essential (primary) hypertension: Secondary | ICD-10-CM | POA: Diagnosis not present

## 2018-11-08 ENCOUNTER — Other Ambulatory Visit: Payer: Self-pay | Admitting: General Surgery

## 2018-11-08 DIAGNOSIS — N6092 Unspecified benign mammary dysplasia of left breast: Secondary | ICD-10-CM

## 2018-11-11 HISTORY — PX: BREAST EXCISIONAL BIOPSY: SUR124

## 2018-11-15 ENCOUNTER — Encounter (HOSPITAL_BASED_OUTPATIENT_CLINIC_OR_DEPARTMENT_OTHER): Payer: Self-pay | Admitting: *Deleted

## 2018-11-15 ENCOUNTER — Other Ambulatory Visit: Payer: Self-pay

## 2018-11-18 ENCOUNTER — Other Ambulatory Visit: Payer: Self-pay

## 2018-11-18 ENCOUNTER — Encounter (HOSPITAL_BASED_OUTPATIENT_CLINIC_OR_DEPARTMENT_OTHER)
Admission: RE | Admit: 2018-11-18 | Discharge: 2018-11-18 | Disposition: A | Discharge status: Home / Self Care | Payer: BC Managed Care – PPO | Source: Ambulatory Visit | Attending: General Surgery | Admitting: General Surgery

## 2018-11-18 DIAGNOSIS — Z01812 Encounter for preprocedural laboratory examination: Secondary | ICD-10-CM | POA: Insufficient documentation

## 2018-11-18 LAB — COMPREHENSIVE METABOLIC PANEL
ALT: 15 U/L (ref 0–44)
AST: 18 U/L (ref 15–41)
Albumin: 4.2 g/dL (ref 3.5–5.0)
Alkaline Phosphatase: 34 U/L — ABNORMAL LOW (ref 38–126)
Anion gap: 8 (ref 5–15)
BUN: 10 mg/dL (ref 6–20)
CO2: 26 mmol/L (ref 22–32)
Calcium: 9.1 mg/dL (ref 8.9–10.3)
Chloride: 102 mmol/L (ref 98–111)
Creatinine, Ser: 0.78 mg/dL (ref 0.44–1.00)
GFR calc Af Amer: >60 mL/min (ref >60)
GFR calc non Af Amer: >60 mL/min (ref >60)
Glucose, Bld: 98 mg/dL (ref 70–99)
Potassium: 3.7 mmol/L (ref 3.5–5.1)
Sodium: 136 mmol/L (ref 135–145)
Total Bilirubin: 1.5 mg/dL — ABNORMAL HIGH (ref 0.3–1.2)
Total Protein: 6.7 g/dL (ref 6.5–8.1)

## 2018-11-18 LAB — CBC WITH DIFFERENTIAL/PLATELET
Abs Immature Granulocytes: 0.01 10*3/uL (ref 0.00–0.07)
Basophils Absolute: 0 10*3/uL (ref 0.0–0.1)
Basophils Relative: 1 %
Eosinophils Absolute: 0 10*3/uL (ref 0.0–0.5)
Eosinophils Relative: 0 %
HCT: 41.1 % (ref 36.0–46.0)
Hemoglobin: 13.9 g/dL (ref 12.0–15.0)
Immature Granulocytes: 0 %
Lymphocytes Relative: 26 %
Lymphs Abs: 1.5 10*3/uL (ref 0.7–4.0)
MCH: 30.5 pg (ref 26.0–34.0)
MCHC: 33.8 g/dL (ref 30.0–36.0)
MCV: 90.3 fL (ref 80.0–100.0)
Monocytes Absolute: 0.6 10*3/uL (ref 0.1–1.0)
Monocytes Relative: 9 %
Neutro Abs: 3.7 10*3/uL (ref 1.7–7.7)
Neutrophils Relative %: 64 %
Platelets: 288 10*3/uL (ref 150–400)
RBC: 4.55 MIL/uL (ref 3.87–5.11)
RDW: 11.7 % (ref 11.5–15.5)
WBC: 5.8 10*3/uL (ref 4.0–10.5)
nRBC: 0 % (ref 0.0–0.2)

## 2018-11-18 NOTE — Progress Notes (Signed)
Ensure presurgery drink given with instructions to complete by 0700 dos, surgical soap given with instructions, pt verbalized understanding. 

## 2018-11-18 NOTE — H&P (Signed)
Madeline Morris  Location: [facility] Patient #: [id number] DOB: 29-Jan-1961 Married / Language: English / Race: White Female       History of Present Illness      This is a pleasant, 58 year old female. Her husband is with her throughout the encounter. She is referred by Dr. Amie Portlandavid Ormond at [facility] for atypical hyperplasia left breast, upper outer quadrant. [name] is her PCP. Her gynecologist used to be Dr. Dierdre ForthVanessa Haygood but [place]      She's had 3 or 4 open breast biopsies in her life, starting at age 58. These have always been benign. 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 She is felt a lump in the upper outer left breast for about a year. She gets annual screening. Recent mammograms show category D density.  Ultrasound shows a 2.5 x 3.1 cm mass in the left breast 1 o'clock position, 2 cm nipple. Ultrasound also shows 1.2 cm mass left breast 1 o'clock position 4 cm from the nipple Ultrasound also shows 1.0 cm mass left breast, 1 o'clock position, 5 cm from the nipple Also noted is a 1.1 cm mass left breast, 12 o'clock position, 3 cm from the nipple Axilla looks negative The largest mass was biopsied and shows atypical ductal hyperplasia. They called her back in the other 3 masses were biopsied and they were benign fibroadenomas.      Past medical history reveals multiple breast biopsies for benign disease. Hypertension. Takes hormone replacement therapy as well as antihypertensives. Otherwise pretty healthy. Family history is negative for breast or ovarian cancer but several family members who were smokers had lung cancer but parents are deceased of lung cancer Social history reveals she is married. She's been pregnant once but has no children. Denies tobacco. Has 2 alcoholic beverages a day. 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 Her husband has 2 children from his  previous marriage but they do not live with them but they do visit.       We had a long discussion. Basically she needs to have the atypical ductal hyperplasia within the palpable mass removed due to the risk of upgrade to cancer being at least 15%. She agrees. She will be scheduled for left breast lumpectomy with radioactive seed localization. Discussed the indications, details, techniques, numerous risk of the surgery with her and her husband. They're aware the risk of bleeding, infection, cosmetic deformity, nerve damage with chronic pain, reoperation if cancer is found. They understand all these issues. All other questions were answered. They agree with this plan.    Past Surgical History  Breast Biopsy  Bilateral. multiple Breast Mass; Local Excision  Bilateral. Colon Polyp Removal - Colonoscopy  Hysterectomy (not due to cancer) - Complete  Oral Surgery  Tonsillectomy   Diagnostic Studies History  Colonoscopy  within last year Mammogram  within last year Pap Smear  1-5 years ago  Allergies  No Known Drug Allergies Allergies Reconciled   Medication History  Estradiol (1MG  Tablet, Oral) Active. Intrarosa (6.5MG  Insert, Vaginal) Active. Losartan Potassium (100MG  Tablet, Oral) Active. Triamterene-HCTZ (37.5-25MG  Tablet, Oral) Active. Vitamin D (Cholecalciferol) (25 MCG(1000 UT) Tablet, Oral) Active. Medications Reconciled  Pregnancy / Birth History  Age at menarche  12 years. Age of menopause  6446-50 Contraceptive History  Intrauterine device, Oral contraceptives. Gravida  1 Maternal age  59-35 Para  0  Other Problems  Anxiety Disorder  High blood pressure  Lump In Breast  Migraine Headache  Oophorectomy     Review of Systems ( General Not Present- Appetite Loss, Chills, Fatigue, Fever, Night Sweats, Weight Gain and Weight Loss. Skin Not Present- Change in Wart/Mole, Dryness, Hives, Jaundice, New Lesions, Non-Healing Wounds, Rash and  Ulcer. HEENT Present- Seasonal Allergies and Wears glasses/contact lenses. Not Present- Earache, Hearing Loss, Hoarseness, Nose Bleed, Oral Ulcers, Ringing in the Ears, Sinus Pain, Sore Throat, Visual Disturbances and Yellow Eyes. Respiratory Not Present- Bloody sputum, Chronic Cough, Difficulty Breathing, Snoring and Wheezing. Breast Present- Breast Mass. Not Present- Breast Pain, Nipple Discharge and Skin Changes. Cardiovascular Not Present- Chest Pain, Difficulty Breathing Lying Down, Leg Cramps, Palpitations, Rapid Heart Rate, Shortness of Breath and Swelling of Extremities. Gastrointestinal Not Present- Abdominal Pain, Bloating, Bloody Stool, Change in Bowel Habits, Chronic diarrhea, Constipation, Difficulty Swallowing, Excessive gas, Gets full quickly at meals, Hemorrhoids, Indigestion, Nausea, Rectal Pain and Vomiting. Female Genitourinary Not Present- Frequency, Nocturia, Painful Urination, Pelvic Pain and Urgency. Musculoskeletal Not Present- Back Pain, Joint Pain, Joint Stiffness, Muscle Pain, Muscle Weakness and Swelling of Extremities. Neurological Not Present- Decreased Memory, Fainting, Headaches, Numbness, Seizures, Tingling, Tremor, Trouble walking and Weakness. Psychiatric Present- Anxiety. Not Present- Bipolar, Change in Sleep Pattern, Depression, Fearful and Frequent crying. Endocrine Not Present- Cold Intolerance, Excessive Hunger, Hair Changes, Heat Intolerance, Hot flashes and New Diabetes. Hematology Not Present- Blood Thinners, Easy Bruising, Excessive bleeding, Gland problems, HIV and Persistent Infections.  Vitals  Weight: 133.8 lb Height: 67in Body Surface Area: 1.7 m Body Mass Index: 20.96 kg/m  Temp.: 98.75F  Pulse: 92 (Regular)  BP: 138/82 (Sitting, Left Arm, Standard)       Physical Exam  General Mental Status-Alert. General Appearance-Consistent with stated age. Hydration-Well hydrated. Voice-Normal.  Head and  Neck Head-normocephalic, atraumatic with no lesions or palpable masses. Trachea-midline. Thyroid Gland Characteristics - normal size and consistency.  Eye Eyeball - Bilateral-Extraocular movements intact. Sclera/Conjunctiva - Bilateral-No scleral icterus.  Chest and Lung Exam Chest and lung exam reveals -quiet, even and easy respiratory effort with no use of accessory muscles and on auscultation, normal breath sounds, no adventitious sounds and normal vocal resonance. Inspection Chest Wall - Normal. Back - normal.  Breast Note: Breasts are reasonably good-sized. Score right breast 9 o'clock position. Old scar left breast 9 o'clock position. Transverse scar left breast 6 o'clock position. There is a 2.5 cm firm palpable mass in the left breast at the 1 o'clock position. There are no other skin changes. There are no other dominant masses. The breast are firm and dense. There is no axillary adenopathy   Cardiovascular Cardiovascular examination reveals -normal heart sounds, regular rate and rhythm with no murmurs and normal pedal pulses bilaterally.  Abdomen Inspection Inspection of the abdomen reveals - No Hernias. Skin - Scar - no surgical scars. Palpation/Percussion Palpation and Percussion of the abdomen reveal - Soft, Non Tender, No Rebound tenderness, No Rigidity (guarding) and No hepatosplenomegaly. Auscultation Auscultation of the abdomen reveals - Bowel sounds normal.  Neurologic Neurologic evaluation reveals -alert and oriented x 3 with no impairment of recent or remote memory. Mental Status-Normal.  Musculoskeletal Normal Exam - Left-Upper Extremity Strength Normal and Lower Extremity Strength Normal. Normal Exam - Right-Upper Extremity Strength Normal and Lower Extremity Strength Normal.  Lymphatic Head & Neck  General Head & Neck Lymphatics: Bilateral - Description - Normal. Axillary  General Axillary Region: Bilateral - Description - Normal.  Tenderness - Non Tender. Femoral & Inguinal  Generalized Femoral & Inguinal Lymphatics: Bilateral -  Description - Normal. Tenderness - Non Tender.    Assessment & Plan  ATYPICAL DUCTAL HYPERPLASIA OF LEFT BREAST (N60.92)    You have a long history of benign breast biopsies You have recently undergone for image guided core needle biopsies of the left breast 3 of these showed completely benign fibroadenomas One of these shows atypical ductal hyperplasia. This is most likely the largest, palpable mass.  Atypical hyperplasia is a high risk lesion and there maybe as much as a 15% chance of early cancer within this Therefore excision is strongly recommended and you agree  you will be scheduled for left breast lumpectomy with radioactive seed localization I discussed the indications, techniques, and risk of this surgery in detail with you and your husband  HYPERTENSION, ESSENTIAL, BENIGN (I10)    Angelia MouldHaywood M. Derrell LollingIngram, M.D., [facility] General and Minimally invasive Surgery Breast and Colorectal Surgery Office:   [phone] Pager:   [phone]

## 2018-11-19 ENCOUNTER — Other Ambulatory Visit (HOSPITAL_COMMUNITY)
Admission: RE | Admit: 2018-11-19 | Discharge: 2018-11-19 | Disposition: A | Discharge status: Home / Self Care | Payer: BC Managed Care – PPO | Source: Ambulatory Visit | Attending: General Surgery | Admitting: General Surgery

## 2018-11-19 DIAGNOSIS — Z01812 Encounter for preprocedural laboratory examination: Secondary | ICD-10-CM | POA: Insufficient documentation

## 2018-11-19 DIAGNOSIS — Z1159 Encounter for screening for other viral diseases: Secondary | ICD-10-CM | POA: Insufficient documentation

## 2018-11-20 LAB — NOVEL CORONAVIRUS, NAA (HOSP ORDER, SEND-OUT TO REF LAB; TAT 18-24 HRS): SARS-CoV-2, NAA: NOT DETECTED

## 2018-11-21 ENCOUNTER — Other Ambulatory Visit: Payer: Self-pay

## 2018-11-21 ENCOUNTER — Ambulatory Visit
Admission: RE | Admit: 2018-11-21 | Discharge: 2018-11-21 | Disposition: A | Discharge status: Home / Self Care | Payer: BLUE CROSS/BLUE SHIELD | Source: Ambulatory Visit | Attending: General Surgery | Admitting: General Surgery

## 2018-11-21 DIAGNOSIS — R928 Other abnormal and inconclusive findings on diagnostic imaging of breast: Secondary | ICD-10-CM | POA: Diagnosis not present

## 2018-11-21 DIAGNOSIS — N6092 Unspecified benign mammary dysplasia of left breast: Secondary | ICD-10-CM

## 2018-11-22 ENCOUNTER — Ambulatory Visit (HOSPITAL_BASED_OUTPATIENT_CLINIC_OR_DEPARTMENT_OTHER): Payer: BC Managed Care – PPO | Admitting: Certified Registered"

## 2018-11-22 ENCOUNTER — Other Ambulatory Visit: Payer: Self-pay

## 2018-11-22 ENCOUNTER — Encounter (HOSPITAL_BASED_OUTPATIENT_CLINIC_OR_DEPARTMENT_OTHER): Payer: Self-pay | Admitting: *Deleted

## 2018-11-22 ENCOUNTER — Ambulatory Visit
Admission: RE | Admit: 2018-11-22 | Discharge: 2018-11-22 | Disposition: A | Discharge status: Home / Self Care | Payer: BC Managed Care – PPO | Source: Ambulatory Visit | Attending: General Surgery | Admitting: General Surgery

## 2018-11-22 ENCOUNTER — Encounter (HOSPITAL_BASED_OUTPATIENT_CLINIC_OR_DEPARTMENT_OTHER)
Admission: RE | Disposition: A | Discharge status: Home / Self Care | Payer: Self-pay | Source: Home / Self Care | Attending: General Surgery

## 2018-11-22 ENCOUNTER — Ambulatory Visit (HOSPITAL_BASED_OUTPATIENT_CLINIC_OR_DEPARTMENT_OTHER)
Admission: RE | Admit: 2018-11-22 | Discharge: 2018-11-22 | Disposition: A | Discharge status: Home / Self Care | Payer: BC Managed Care – PPO | Attending: General Surgery | Admitting: General Surgery

## 2018-11-22 DIAGNOSIS — D242 Benign neoplasm of left breast: Secondary | ICD-10-CM | POA: Diagnosis not present

## 2018-11-22 DIAGNOSIS — R928 Other abnormal and inconclusive findings on diagnostic imaging of breast: Secondary | ICD-10-CM | POA: Diagnosis not present

## 2018-11-22 DIAGNOSIS — Z79890 Hormone replacement therapy (postmenopausal): Secondary | ICD-10-CM | POA: Insufficient documentation

## 2018-11-22 DIAGNOSIS — Z79899 Other long term (current) drug therapy: Secondary | ICD-10-CM | POA: Insufficient documentation

## 2018-11-22 DIAGNOSIS — N6092 Unspecified benign mammary dysplasia of left breast: Secondary | ICD-10-CM | POA: Diagnosis not present

## 2018-11-22 DIAGNOSIS — N809 Endometriosis, unspecified: Secondary | ICD-10-CM | POA: Diagnosis not present

## 2018-11-22 DIAGNOSIS — A499 Bacterial infection, unspecified: Secondary | ICD-10-CM | POA: Diagnosis not present

## 2018-11-22 DIAGNOSIS — N6082 Other benign mammary dysplasias of left breast: Secondary | ICD-10-CM | POA: Diagnosis not present

## 2018-11-22 DIAGNOSIS — I10 Essential (primary) hypertension: Secondary | ICD-10-CM | POA: Diagnosis not present

## 2018-11-22 DIAGNOSIS — N6012 Diffuse cystic mastopathy of left breast: Secondary | ICD-10-CM | POA: Diagnosis not present

## 2018-11-22 DIAGNOSIS — R030 Elevated blood-pressure reading, without diagnosis of hypertension: Secondary | ICD-10-CM | POA: Diagnosis not present

## 2018-11-22 HISTORY — DX: Nausea with vomiting, unspecified: R11.2

## 2018-11-22 HISTORY — PX: BREAST LUMPECTOMY WITH RADIOACTIVE SEED LOCALIZATION: SHX6424

## 2018-11-22 HISTORY — DX: Unspecified benign mammary dysplasia of left breast: N60.92

## 2018-11-22 HISTORY — DX: Nausea with vomiting, unspecified: Z98.890

## 2018-11-22 SURGERY — BREAST LUMPECTOMY WITH RADIOACTIVE SEED LOCALIZATION
Anesthesia: General | Site: Breast | Laterality: Left

## 2018-11-22 MED ORDER — LIDOCAINE 2% (20 MG/ML) 5 ML SYRINGE
INTRAMUSCULAR | Status: AC
  Filled 2018-11-22: qty 5

## 2018-11-22 MED ORDER — ONDANSETRON HCL 4 MG/2ML IJ SOLN
INTRAMUSCULAR | Status: AC
  Filled 2018-11-22: qty 4

## 2018-11-22 MED ORDER — FENTANYL CITRATE (PF) 100 MCG/2ML IJ SOLN
25.0000 ug | INTRAMUSCULAR | Status: DC | PRN
  Administered 2018-11-22: 50 ug via INTRAVENOUS

## 2018-11-22 MED ORDER — SCOPOLAMINE 1 MG/3DAYS TD PT72
1.0000 | MEDICATED_PATCH | Freq: Once | TRANSDERMAL | Status: DC | PRN
  Administered 2018-11-22: 1.5 mg via TRANSDERMAL

## 2018-11-22 MED ORDER — FENTANYL CITRATE (PF) 100 MCG/2ML IJ SOLN
25.0000 ug | INTRAMUSCULAR | Status: DC | PRN
  Administered 2018-11-22: 25 ug via INTRAVENOUS

## 2018-11-22 MED ORDER — CELECOXIB 200 MG PO CAPS
200.0000 mg | ORAL_CAPSULE | ORAL | Status: AC
  Administered 2018-11-22: 09:00:00 200 mg via ORAL

## 2018-11-22 MED ORDER — ACETAMINOPHEN 500 MG PO TABS
1000.0000 mg | ORAL_TABLET | ORAL | Status: AC
  Administered 2018-11-22: 09:00:00 1000 mg via ORAL

## 2018-11-22 MED ORDER — FENTANYL CITRATE (PF) 100 MCG/2ML IJ SOLN
INTRAMUSCULAR | Status: DC | PRN
  Administered 2018-11-22: 50 ug via INTRAVENOUS
  Administered 2018-11-22: 25 ug via INTRAVENOUS

## 2018-11-22 MED ORDER — CHLORHEXIDINE GLUCONATE CLOTH 2 % EX PADS: 6.0000 | MEDICATED_PAD | Freq: Once | CUTANEOUS | Status: DC

## 2018-11-22 MED ORDER — OXYCODONE HCL 5 MG/5ML PO SOLN: 5.0000 mg | Freq: Once | ORAL | Status: DC | PRN

## 2018-11-22 MED ORDER — CEFAZOLIN SODIUM-DEXTROSE 2-4 GM/100ML-% IV SOLN
2.0000 g | INTRAVENOUS | Status: AC
  Administered 2018-11-22: 2 g via INTRAVENOUS

## 2018-11-22 MED ORDER — SCOPOLAMINE 1 MG/3DAYS TD PT72
MEDICATED_PATCH | TRANSDERMAL | Status: AC
  Filled 2018-11-22: qty 1

## 2018-11-22 MED ORDER — ACETAMINOPHEN 160 MG/5ML PO SOLN: 325.0000 mg | Freq: Once | ORAL | Status: DC | PRN

## 2018-11-22 MED ORDER — FENTANYL CITRATE (PF) 100 MCG/2ML IJ SOLN: 50.0000 ug | INTRAMUSCULAR | Status: DC | PRN

## 2018-11-22 MED ORDER — PROMETHAZINE HCL 25 MG/ML IJ SOLN
INTRAMUSCULAR | Status: AC
  Filled 2018-11-22: qty 1

## 2018-11-22 MED ORDER — CEFAZOLIN SODIUM-DEXTROSE 2-4 GM/100ML-% IV SOLN
INTRAVENOUS | Status: AC
  Filled 2018-11-22: qty 100

## 2018-11-22 MED ORDER — LACTATED RINGERS IV SOLN: INTRAVENOUS | Status: DC

## 2018-11-22 MED ORDER — CELECOXIB 200 MG PO CAPS
ORAL_CAPSULE | ORAL | Status: AC
  Filled 2018-11-22: qty 1

## 2018-11-22 MED ORDER — LIDOCAINE 2% (20 MG/ML) 5 ML SYRINGE
INTRAMUSCULAR | Status: DC | PRN
  Administered 2018-11-22: 50 mg via INTRAVENOUS

## 2018-11-22 MED ORDER — PROPOFOL 10 MG/ML IV BOLUS
INTRAVENOUS | Status: DC | PRN
  Administered 2018-11-22: 200 mg via INTRAVENOUS

## 2018-11-22 MED ORDER — ACETAMINOPHEN 325 MG PO TABS: 325.0000 mg | ORAL_TABLET | Freq: Once | ORAL | Status: DC | PRN

## 2018-11-22 MED ORDER — SODIUM CHLORIDE 0.9 % IV SOLN: 250.0000 mL | INTRAVENOUS | Status: DC | PRN

## 2018-11-22 MED ORDER — OXYCODONE HCL 5 MG PO TABS: 5.0000 mg | ORAL_TABLET | ORAL | Status: DC | PRN

## 2018-11-22 MED ORDER — ACETAMINOPHEN 10 MG/ML IV SOLN: 1000.0000 mg | Freq: Once | INTRAVENOUS | Status: DC | PRN

## 2018-11-22 MED ORDER — PROPOFOL 10 MG/ML IV BOLUS
INTRAVENOUS | Status: AC
  Filled 2018-11-22: qty 20

## 2018-11-22 MED ORDER — MIDAZOLAM HCL 2 MG/2ML IJ SOLN: 1.0000 mg | INTRAMUSCULAR | Status: DC | PRN

## 2018-11-22 MED ORDER — ONDANSETRON HCL 4 MG/2ML IJ SOLN
INTRAMUSCULAR | Status: DC | PRN
  Administered 2018-11-22 (×2): 4 mg via INTRAVENOUS

## 2018-11-22 MED ORDER — OXYCODONE HCL 5 MG PO TABS: 5.0000 mg | ORAL_TABLET | Freq: Once | ORAL | Status: DC | PRN

## 2018-11-22 MED ORDER — SODIUM CHLORIDE 0.9% FLUSH: 3.0000 mL | Freq: Two times a day (BID) | INTRAVENOUS | Status: DC

## 2018-11-22 MED ORDER — BUPIVACAINE-EPINEPHRINE (PF) 0.5% -1:200000 IJ SOLN
INTRAMUSCULAR | Status: DC | PRN
  Administered 2018-11-22: 9 mL

## 2018-11-22 MED ORDER — FENTANYL CITRATE (PF) 100 MCG/2ML IJ SOLN
INTRAMUSCULAR | Status: AC
  Filled 2018-11-22: qty 2

## 2018-11-22 MED ORDER — GABAPENTIN 300 MG PO CAPS
300.0000 mg | ORAL_CAPSULE | ORAL | Status: AC
  Administered 2018-11-22: 09:00:00 300 mg via ORAL

## 2018-11-22 MED ORDER — ACETAMINOPHEN 325 MG PO TABS: 650.0000 mg | ORAL_TABLET | ORAL | Status: DC | PRN

## 2018-11-22 MED ORDER — DEXAMETHASONE SODIUM PHOSPHATE 10 MG/ML IJ SOLN
INTRAMUSCULAR | Status: DC | PRN
  Administered 2018-11-22: 10 mg via INTRAVENOUS

## 2018-11-22 MED ORDER — DEXAMETHASONE SODIUM PHOSPHATE 10 MG/ML IJ SOLN
INTRAMUSCULAR | Status: AC
  Filled 2018-11-22: qty 1

## 2018-11-22 MED ORDER — MIDAZOLAM HCL 5 MG/5ML IJ SOLN
INTRAMUSCULAR | Status: DC | PRN
  Administered 2018-11-22: 2 mg via INTRAVENOUS

## 2018-11-22 MED ORDER — MIDAZOLAM HCL 2 MG/2ML IJ SOLN
INTRAMUSCULAR | Status: AC
  Filled 2018-11-22: qty 2

## 2018-11-22 MED ORDER — ACETAMINOPHEN 500 MG PO TABS: 1000.0000 mg | ORAL_TABLET | Freq: Four times a day (QID) | ORAL | Status: DC

## 2018-11-22 MED ORDER — HYDROCODONE-ACETAMINOPHEN 5-325 MG PO TABS: 1.0000 | ORAL_TABLET | Freq: Four times a day (QID) | ORAL | 0 refills | Status: DC | PRN

## 2018-11-22 MED ORDER — ACETAMINOPHEN 650 MG RE SUPP: 650.0000 mg | RECTAL | Status: DC | PRN

## 2018-11-22 MED ORDER — PROMETHAZINE HCL 25 MG/ML IJ SOLN
6.2500 mg | INTRAMUSCULAR | Status: DC | PRN
  Administered 2018-11-22: 6.25 mg via INTRAVENOUS

## 2018-11-22 MED ORDER — LACTATED RINGERS IV SOLN
INTRAVENOUS | Status: DC
  Administered 2018-11-22 (×2): via INTRAVENOUS

## 2018-11-22 MED ORDER — ACETAMINOPHEN 500 MG PO TABS
ORAL_TABLET | ORAL | Status: AC
  Filled 2018-11-22: qty 2

## 2018-11-22 MED ORDER — GABAPENTIN 300 MG PO CAPS
ORAL_CAPSULE | ORAL | Status: AC
  Filled 2018-11-22: qty 1

## 2018-11-22 MED ORDER — SODIUM CHLORIDE 0.9% FLUSH: 3.0000 mL | INTRAVENOUS | Status: DC | PRN

## 2018-11-22 SURGICAL SUPPLY — 69 items
ADH SKN CLS APL DERMABOND .7 (GAUZE/BANDAGES/DRESSINGS) ×1
APL PRP STRL LF DISP 70% ISPRP (MISCELLANEOUS) ×1
APL SKNCLS STERI-STRIP NONHPOA (GAUZE/BANDAGES/DRESSINGS)
APPLIER CLIP 9.375 MED OPEN (MISCELLANEOUS) ×3
APR CLP MED 9.3 20 MLT OPN (MISCELLANEOUS) ×1
BENZOIN TINCTURE PRP APPL 2/3 (GAUZE/BANDAGES/DRESSINGS) IMPLANT
BINDER BREAST LRG (GAUZE/BANDAGES/DRESSINGS) IMPLANT
BINDER BREAST MEDIUM (GAUZE/BANDAGES/DRESSINGS) ×2 IMPLANT
BINDER BREAST XLRG (GAUZE/BANDAGES/DRESSINGS) IMPLANT
BINDER BREAST XXLRG (GAUZE/BANDAGES/DRESSINGS) IMPLANT
BLADE HEX COATED 2.75 (ELECTRODE) ×3 IMPLANT
BLADE SURG 10 STRL SS (BLADE) IMPLANT
BLADE SURG 15 STRL LF DISP TIS (BLADE) ×1 IMPLANT
BLADE SURG 15 STRL SS (BLADE) ×6
CANISTER SUC SOCK COL 7IN (MISCELLANEOUS) IMPLANT
CANISTER SUCT 1200ML W/VALVE (MISCELLANEOUS) ×3 IMPLANT
CHLORAPREP W/TINT 26 (MISCELLANEOUS) ×3 IMPLANT
CLIP APPLIE 9.375 MED OPEN (MISCELLANEOUS) IMPLANT
CLOSURE WOUND 1/2 X4 (GAUZE/BANDAGES/DRESSINGS)
COVER BACK TABLE REUSABLE LG (DRAPES) ×3 IMPLANT
COVER MAYO STAND REUSABLE (DRAPES) ×3 IMPLANT
COVER PROBE W GEL 5X96 (DRAPES) ×3 IMPLANT
COVER WAND RF STERILE (DRAPES) IMPLANT
DECANTER SPIKE VIAL GLASS SM (MISCELLANEOUS) IMPLANT
DERMABOND ADVANCED (GAUZE/BANDAGES/DRESSINGS) ×2
DERMABOND ADVANCED .7 DNX12 (GAUZE/BANDAGES/DRESSINGS) ×1 IMPLANT
DRAPE LAPAROSCOPIC ABDOMINAL (DRAPES) ×3 IMPLANT
DRAPE UTILITY XL STRL (DRAPES) ×3 IMPLANT
DRSG PAD ABDOMINAL 8X10 ST (GAUZE/BANDAGES/DRESSINGS) ×3 IMPLANT
ELECT COATED BLADE 2.86 ST (ELECTRODE) ×2 IMPLANT
ELECT REM PT RETURN 9FT ADLT (ELECTROSURGICAL) ×3
ELECTRODE REM PT RTRN 9FT ADLT (ELECTROSURGICAL) ×1 IMPLANT
GAUZE SPONGE 4X4 12PLY STRL LF (GAUZE/BANDAGES/DRESSINGS) ×3 IMPLANT
GLOVE BIOGEL PI IND STRL 6.5 (GLOVE) IMPLANT
GLOVE BIOGEL PI INDICATOR 6.5 (GLOVE) ×4
GLOVE ECLIPSE 6.5 STRL STRAW (GLOVE) ×6 IMPLANT
GLOVE EUDERMIC 7 POWDERFREE (GLOVE) ×5 IMPLANT
GLOVE EXAM NITRILE MD LF STRL (GLOVE) ×3 IMPLANT
GOWN STRL REUS W/ TWL LRG LVL3 (GOWN DISPOSABLE) ×1 IMPLANT
GOWN STRL REUS W/ TWL XL LVL3 (GOWN DISPOSABLE) ×1 IMPLANT
GOWN STRL REUS W/TWL LRG LVL3 (GOWN DISPOSABLE) ×3
GOWN STRL REUS W/TWL XL LVL3 (GOWN DISPOSABLE) ×6
ILLUMINATOR WAVEGUIDE N/F (MISCELLANEOUS) IMPLANT
KIT MARKER MARGIN INK (KITS) ×3 IMPLANT
LIGHT WAVEGUIDE WIDE FLAT (MISCELLANEOUS) IMPLANT
NDL HYPO 25X1 1.5 SAFETY (NEEDLE) ×1 IMPLANT
NEEDLE HYPO 25X1 1.5 SAFETY (NEEDLE) ×3 IMPLANT
NS IRRIG 1000ML POUR BTL (IV SOLUTION) ×3 IMPLANT
PACK BASIN DAY SURGERY FS (CUSTOM PROCEDURE TRAY) ×3 IMPLANT
PENCIL BUTTON HOLSTER BLD 10FT (ELECTRODE) ×3 IMPLANT
SHEET MEDIUM DRAPE 40X70 STRL (DRAPES) IMPLANT
SLEEVE SCD COMPRESS KNEE MED (MISCELLANEOUS) ×3 IMPLANT
SPONGE LAP 18X18 RF (DISPOSABLE) IMPLANT
SPONGE LAP 4X18 RFD (DISPOSABLE) ×5 IMPLANT
STRIP CLOSURE SKIN 1/2X4 (GAUZE/BANDAGES/DRESSINGS) IMPLANT
SUT ETHILON 3 0 FSL (SUTURE) IMPLANT
SUT MNCRL AB 4-0 PS2 18 (SUTURE) ×3 IMPLANT
SUT SILK 2 0 SH (SUTURE) ×3 IMPLANT
SUT VIC AB 2-0 CT1 27 (SUTURE)
SUT VIC AB 2-0 CT1 TAPERPNT 27 (SUTURE) IMPLANT
SUT VIC AB 3-0 SH 27 (SUTURE)
SUT VIC AB 3-0 SH 27X BRD (SUTURE) IMPLANT
SUT VICRYL 3-0 CR8 SH (SUTURE) ×3 IMPLANT
SYR 10ML LL (SYRINGE) ×3 IMPLANT
TOWEL GREEN STERILE FF (TOWEL DISPOSABLE) ×3 IMPLANT
TRAY FAXITRON CT DISP (TRAY / TRAY PROCEDURE) ×3 IMPLANT
TUBE CONNECTING 20'X1/4 (TUBING) ×1
TUBE CONNECTING 20X1/4 (TUBING) ×2 IMPLANT
YANKAUER SUCT BULB TIP NO VENT (SUCTIONS) ×3 IMPLANT

## 2018-11-22 NOTE — Transfer of Care (Signed)
Immediate Anesthesia Transfer of Care Note  Patient: Madeline Morris  Procedure(s) Performed: LEFT BREAST LUMPECTOMY WITH RADIOACTIVE SEED LOCALIZATION (Left Breast)  Patient Location: PACU  Anesthesia Type:General  Level of Consciousness: drowsy and patient cooperative  Airway & Oxygen Therapy: Patient Spontanous Breathing and Patient connected to nasal cannula oxygen  Post-op Assessment: Report given to RN and Post -op Vital signs reviewed and stable  Post vital signs: Reviewed and stable  Last Vitals:  Vitals Value Taken Time  BP 128/80 11/22/18 1143  Temp    Pulse 77 11/22/18 1143  Resp 17 11/22/18 1143  SpO2 100 % 11/22/18 1143  Vitals shown include unvalidated device data.  Last Pain:  Vitals:   11/22/18 0918  TempSrc: Oral  PainSc: 0-No pain      Patients Stated Pain Goal: 0 (34/74/25 9563)  Complications: No apparent anesthesia complications

## 2018-11-22 NOTE — Anesthesia Preprocedure Evaluation (Addendum)
Anesthesia Evaluation  Patient identified by MRN, date of birth, ID band Patient awake    Reviewed: Allergy & Precautions, NPO status , Patient's Chart, lab work & pertinent test results  History of Anesthesia Complications (+) PONV  Airway Mallampati: I  TM Distance: >3 FB Neck ROM: Full    Dental  (+) Teeth Intact, Dental Advisory Given   Pulmonary neg pulmonary ROS,    breath sounds clear to auscultation       Cardiovascular hypertension, Pt. on medications  Rhythm:Regular Rate:Normal     Neuro/Psych negative neurological ROS     GI/Hepatic negative GI ROS, Neg liver ROS,   Endo/Other  negative endocrine ROS  Renal/GU negative Renal ROS     Musculoskeletal negative musculoskeletal ROS (+)   Abdominal Normal abdominal exam  (+)   Peds  Hematology negative hematology ROS (+)   Anesthesia Other Findings   Reproductive/Obstetrics                            Anesthesia Physical Anesthesia Plan  ASA: II  Anesthesia Plan: General   Post-op Pain Management:    Induction: Intravenous  PONV Risk Score and Plan: 4 or greater and Ondansetron, Dexamethasone, Midazolam and Scopolamine patch - Pre-op  Airway Management Planned: LMA  Additional Equipment: None  Intra-op Plan:   Post-operative Plan: Extubation in OR  Informed Consent: I have reviewed the patients History and Physical, chart, labs and discussed the procedure including the risks, benefits and alternatives for the proposed anesthesia with the patient or authorized representative who has indicated his/her understanding and acceptance.     Dental advisory given  Plan Discussed with: CRNA  Anesthesia Plan Comments: (COVID-19 Labs  No results for input(s): DDIMER, FERRITIN, LDH, CRP in the last 72 hours.  Lab Results      Component                Value               Date                      [id number]               NOT DETECTED        11/19/2018            )       Anesthesia Quick Evaluation

## 2018-11-22 NOTE — Anesthesia Procedure Notes (Signed)
Procedure Name: LMA Insertion Date/Time: 11/22/2018 10:54 AM Performed by: Gwyndolyn Saxon, CRNA Pre-anesthesia Checklist: Patient identified, Emergency Drugs available, Suction available and Patient being monitored Patient Re-evaluated:Patient Re-evaluated prior to induction Oxygen Delivery Method: Circle system utilized Preoxygenation: Pre-oxygenation with 100% oxygen Induction Type: IV induction Ventilation: Mask ventilation without difficulty LMA: LMA inserted LMA Size: 3.0 Number of attempts: 1 Placement Confirmation: positive ETCO2 and breath sounds checked- equal and bilateral Tube secured with: Tape Dental Injury: Teeth and Oropharynx as per pre-operative assessment

## 2018-11-22 NOTE — Interval H&P Note (Signed)
History and Physical Interval Note:  11/22/2018 9:11 AM  Madeline Morris  has presented today for surgery, with the diagnosis of ATYPICAL DUCTAL HYPERPLASIA OF LEFT BREAST.  The various methods of treatment have been discussed with the patient and family. After consideration of risks, benefits and other options for treatment, the patient has consented to  Procedure(s): LEFT BREAST LUMPECTOMY WITH RADIOACTIVE SEED LOCALIZATION (Left) as a surgical intervention.  The patient's history has been reviewed, patient examined, no change in status, stable for surgery.  I have reviewed the patient's chart and labs.  Questions were answered to the patient's satisfaction.     [name]

## 2018-11-22 NOTE — Anesthesia Postprocedure Evaluation (Signed)
Anesthesia Post Note  Patient: Madeline Morris  Procedure(s) Performed: LEFT BREAST LUMPECTOMY WITH RADIOACTIVE SEED LOCALIZATION (Left Breast)     Patient location during evaluation: PACU Anesthesia Type: General Level of consciousness: awake and alert Pain management: pain level controlled Vital Signs Assessment: post-procedure vital signs reviewed and stable Respiratory status: spontaneous breathing, nonlabored ventilation, respiratory function stable and patient connected to nasal cannula oxygen Cardiovascular status: blood pressure returned to baseline and stable Postop Assessment: no apparent nausea or vomiting Anesthetic complications: no    Last Vitals:  Vitals:   11/22/18 1215 11/22/18 1312  BP: 129/79 (!) 141/84  Pulse: 74 63  Resp: (!) 26 18  Temp:  36.8 C  SpO2: 100% 97%    Last Pain:  Vitals:   11/22/18 1312  TempSrc:   PainSc: Dexter Nelissa Bolduc

## 2018-11-22 NOTE — Discharge Instructions (Signed)
[facility] °Office Phone Number [phone] ° °BREAST BIOPSY/ PARTIAL MASTECTOMY: POST OP INSTRUCTIONS ° °Always review your discharge instruction sheet given to you by the facility where your surgery was performed. ° °IF YOU HAVE DISABILITY OR FAMILY LEAVE FORMS, YOU MUST BRING THEM TO THE OFFICE FOR PROCESSING.  DO NOT GIVE THEM TO YOUR DOCTOR. ° °1. A prescription for pain medication may be given to you upon discharge.  Take your pain medication as prescribed, if needed.  If narcotic pain medicine is not needed, then you may take acetaminophen (Tylenol) or ibuprofen (Advil) as needed. °2. Take your usually prescribed medications unless otherwise directed °3. If you need a refill on your pain medication, please contact your pharmacy.  They will contact our office to request authorization.  Prescriptions will not be filled after 5pm or on week-ends. °4. You should eat very light the first 24 hours after surgery, such as soup, crackers, pudding, etc.  Resume your normal diet the day after surgery. °5. Most patients will experience some swelling and bruising in the breast.  Ice packs and a good support bra will help.  Swelling and bruising can take several days to resolve.  °6. It is common to experience some constipation if taking pain medication after surgery.  Increasing fluid intake and taking a stool softener will usually help or prevent this problem from occurring.  A mild laxative (Milk of Magnesia or Miralax) should be taken according to package directions if there are no bowel movements after 48 hours. °7. Unless discharge instructions indicate otherwise, you may remove your bandages 24-48 hours after surgery, and you may shower at that time.  You may have steri-strips (small skin tapes) in place directly over the incision.  These strips should be left on the skin for 7-10 days.  If your surgeon used skin glue on the incision, you may shower in 24 hours.  The glue will flake off over the  next 2-3 weeks.  Any sutures or staples will be removed at the office during your follow-up visit. °8. ACTIVITIES:  You may resume regular daily activities (gradually increasing) beginning the next day.  Wearing a good support bra or sports bra minimizes pain and swelling.  You may have sexual intercourse when it is comfortable. °a. You may drive when you no longer are taking prescription pain medication, you can comfortably wear a seatbelt, and you can safely maneuver your car and apply brakes. °b. RETURN TO WORK:  ______________________________________________________________________________________ °9. You should see your doctor in the office for a follow-up appointment approximately two weeks after your surgery.  Your doctor’s nurse will typically make your follow-up appointment when she calls you with your pathology report.  Expect your pathology report 2-3 business days after your surgery.  You may call to check if you do not hear from us after three days. °10. OTHER INSTRUCTIONS: _______________________________________________________________________________________________ _____________________________________________________________________________________________________________________________________ °_____________________________________________________________________________________________________________________________________ °_____________________________________________________________________________________________________________________________________ ° °WHEN TO CALL YOUR DOCTOR: °1. Fever over 101.0 °2. Nausea and/or vomiting. °3. Extreme swelling or bruising. °4. Continued bleeding from incision. °5. Increased pain, redness, or drainage from the incision. ° °The clinic staff is available to answer your questions during regular business hours.  Please don’t hesitate to call and ask to speak to one of the nurses for clinical concerns.  If you have a medical emergency, go to the nearest  emergency room or call 911.  A surgeon from [facility] is always on call at the hospital. ° °For further questions, please visit [email or web address]  ° ° ° ° ° ° ° ° °••••••••• ° ° °  Managing Your Pain After Surgery Without Opioids ° ° ° °Thank you for participating in our program to help patients manage their pain after surgery without opioids. This is part of our effort to provide you with the best care possible, without exposing you or your family to the risk that opioids pose. ° °What pain can I expect after surgery? °You can expect to have some pain after surgery. This is normal. The pain is typically worse the day after surgery, and quickly begins to get better. °Many studies have found that many patients are able to manage their pain after surgery with Over-the-Counter (OTC) medications such as Tylenol and Motrin. If you have a condition that does not allow you to take Tylenol or Motrin, notify your surgical team. ° °How will I manage my pain? °The best strategy for controlling your pain after surgery is around the clock pain control with Tylenol (acetaminophen) and Motrin (ibuprofen or Advil). Alternating these medications with each other allows you to maximize your pain control. In addition to Tylenol and Motrin, you can use heating pads or ice packs on your incisions to help reduce your pain. ° °How will I alternate your regular strength over-the-counter pain medication? °You will take a dose of pain medication every three hours. °; Start by taking 650 mg of Tylenol (2 pills of 325 mg) °; 3 hours later take 600 mg of Motrin (3 pills of 200 mg) °; 3 hours after taking the Motrin take 650 mg of Tylenol °; 3 hours after that take 600 mg of Motrin. ° ° °- 1 - ° °See example - if your first dose of Tylenol is at 12:00 PM ° ° °12:00 PM Tylenol 650 mg (2 pills of 325 mg)  °3:00 PM Motrin 600 mg (3 pills of 200 mg)  °6:00 PM Tylenol 650 mg (2 pills of 325 mg)  °9:00 PM Motrin 600 mg (3 pills of  200 mg)  °Continue alternating every 3 hours  ° °We recommend that you follow this schedule around-the-clock for at least 3 days after surgery, or until you feel that it is no longer needed. Use the table on the last page of this handout to keep track of the medications you are taking. °Important: °Do not take more than 3000mg of Tylenol or 3200mg of Motrin in a 24-hour period. °Do not take ibuprofen/Motrin if you have a history of bleeding stomach ulcers, severe kidney disease, &/or actively taking a blood thinner ° °What if I still have pain? °If you have pain that is not controlled with the over-the-counter pain medications (Tylenol and Motrin or Advil) you might have what we call “breakthrough” pain. You will receive a prescription for a small amount of an opioid pain medication such as Oxycodone, Tramadol, or Tylenol with Codeine. Use these opioid pills in the first 24 hours after surgery if you have breakthrough pain. Do not take more than 1 pill every 4-6 hours. ° °If you still have uncontrolled pain after using all opioid pills, don't hesitate to call our staff using the number provided. We will help make sure you are managing your pain in the best way possible, and if necessary, we can provide a prescription for additional pain medication. ° ° °Day 1   ° °Time  °Name of Medication Number of pills taken  °Amount of Acetaminophen  °Pain Level  ° °Comments  °AM PM       °AM PM       °AM PM       °  AM PM       °AM PM       °AM PM       °AM PM       °AM PM       °Total Daily amount of Acetaminophen °Do not take more than  3,000 mg per day    ° ° °Day 2   ° °Time  °Name of Medication Number of pills °taken  °Amount of Acetaminophen  °Pain Level  ° °Comments  °AM PM       °AM PM       °AM PM       °AM PM       °AM PM       °AM PM       °AM PM       °AM PM       °Total Daily amount of Acetaminophen °Do not take more than  3,000 mg per day    ° ° °Day 3   ° °Time  °Name of Medication Number of pills taken  °Amount of  Acetaminophen  °Pain Level  ° °Comments  °AM PM       °AM PM       °AM PM       °AM PM       ° ° ° °AM PM       °AM PM       °AM PM       °AM PM       °Total Daily amount of Acetaminophen °Do not take more than  3,000 mg per day    ° ° °Day 4   ° °Time  °Name of Medication Number of pills taken  °Amount of Acetaminophen  °Pain Level  ° °Comments  °AM PM       °AM PM       °AM PM       °AM PM       °AM PM       °AM PM       °AM PM       °AM PM       °Total Daily amount of Acetaminophen °Do not take more than  3,000 mg per day    ° ° °Day 5   ° °Time  °Name of Medication Number °of pills taken  °Amount of Acetaminophen  °Pain Level  ° °Comments  °AM PM       °AM PM       °AM PM       °AM PM       °AM PM       °AM PM       °AM PM       °AM PM       °Total Daily amount of Acetaminophen °Do not take more than  3,000 mg per day    ° ° ° °Day 6   ° °Time  °Name of Medication Number of pills °taken  °Amount of Acetaminophen  °Pain Level  °Comments  °AM PM       °AM PM       °AM PM       °AM PM       °AM PM       °AM PM       °AM PM       °AM PM       °Total Daily amount of Acetaminophen °Do not take more than    3,000 mg per day    ° ° °Day 7   ° °Time  °Name of Medication Number of pills taken  °Amount of Acetaminophen  °Pain Level  ° °Comments  °AM PM       °AM PM       °AM PM       °AM PM       °AM PM       °AM PM       °AM PM       °AM PM       °Total Daily amount of Acetaminophen °Do not take more than  3,000 mg per day    ° ° ° ° °For additional information about how and where to safely dispose of unused opioid °medications - https://www.morepowerfulnc.org ° °Disclaimer: This document contains information and/or instructional materials adapted from [facility] for the typical patient with your condition. It does not replace medical advice from your health care provider because your experience may differ from that of the °typical patient. Talk to your health care provider if you have any questions about  this °document, your condition or your treatment plan. °Adapted from [facility] ° ° ° ° ° °Post Anesthesia Home Care Instructions ° °Activity: °Get plenty of rest for the remainder of the day. A responsible individual must stay with you for 24 hours following the procedure.  °For the next 24 hours, DO NOT: °-Drive a car °-Operate machinery °-Drink alcoholic beverages °-Take any medication unless instructed by your physician °-Make any legal decisions or sign important papers. ° °Meals: °Start with liquid foods such as gelatin or soup. Progress to regular foods as tolerated. Avoid greasy, spicy, heavy foods. If nausea and/or vomiting occur, drink only clear liquids until the nausea and/or vomiting subsides. Call your physician if vomiting continues. ° °Special Instructions/Symptoms: °Your throat may feel dry or sore from the anesthesia or the breathing tube placed in your throat during surgery. If this causes discomfort, gargle with warm salt water. The discomfort should disappear within 24 hours. ° °If you had a scopolamine patch placed behind your ear for the management of post- operative nausea and/or vomiting: ° °1. The medication in the patch is effective for 72 hours, after which it should be removed.  Wrap patch in a tissue and discard in the trash. Wash hands thoroughly with soap and water. °2. You may remove the patch earlier than 72 hours if you experience unpleasant side effects which may include dry mouth, dizziness or visual disturbances. °3. Avoid touching the patch. Wash your hands with soap and water after contact with the patch. °   ° °

## 2018-11-22 NOTE — Op Note (Signed)
Patient Name:           Madeline Morris   Date of Surgery:        11/22/2018  Pre op Diagnosis:      Atypical ductal hyperplasia left breast, upper outer quadrant  Post op Diagnosis:    Same  Procedure:                 Left breast lumpectomy with radioactive seed localization and margin assessment  Surgeon:                     Edsel Petrin. Dalbert Batman, M.D., FACS  Assistant:                      OR staff  Operative Indications:   . This 58 year old female is referred by Dr. Lajean Manes at the [facility] for atypical hyperplasia left breast, upper outer quadrant. [name] is her PCP. Her gynecologist used to be Dr. [name] but now sees [name], [name]      She's had 3 or 4 open breast biopsies in her life, starting at age 77. These have always been benign. 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 She is felt a lump in the upper outer left breast for about a year. She gets annual screening. Recent mammograms show category D density.  Ultrasound shows a 2.5 x 3.1 cm mass in the left breast 1 o'clock position, 2 cm nipple. Ultrasound also shows 1.2 cm mass left breast 1 o'clock position 4 cm from the nipple Ultrasound also shows 1.0 cm mass left breast, 1 o'clock position, 5 cm from the nipple Also noted is a 1.1 cm mass left breast, 12 o'clock position, 3 cm from the nipple Axilla looks negative The largest mass was biopsied and shows atypical ductal hyperplasia. They called her back and the other 3 masses were biopsied and they were benign fibroadenomas.      Past medical history reveals multiple breast biopsies for benign disease. Hypertension. Takes hormone replacement therapy as well as antihypertensives. Otherwise pretty healthy. Family history is negative for breast or ovarian cancer but several family members who were smokers had lung cancer but parents are deceased of lung cancer       We had a long discussion. Basically she needs to have the atypical ductal  hyperplasia within the palpable mass removed due to the risk of upgrade to cancer being at least 15%. She agrees. She will be scheduled for left breast lumpectomy with radioactive seed localizatio They agree with this plan.  Operative Findings:       The radioactive seed was at the inferior edge of the palpable mass.  All of this was removed through a circumareolar incision laterally.  The targeted area was in the anterior third of the breast.  Specimen mammogram looked good containing the seed and the original biopsy clip and they appeared to be in the relative center of the specimen margins were marked.  Procedure in Detail:          Following the induction of general LMA anesthesia the patient's left breast was prepped and draped in a sterile fashion.  Timeout was performed.  Intravenous antibiotics were given.  0.5% Marcaine with epinephrine was used as a local infiltration anesthetic.  Using the neoprobe I isolated the radioactive seed and correlated this with the palpable finding.  I chose to design a curvilinear circumareolar incision about 2 cm outside the areolar margin.  The lumpectomy was performed with electrocautery and the neoprobe.  The specimen was removed and marked with silk sutures and a 6 color ink kit.  The specimen mammogram looked good as described above.  The specimen was sent to the lab where the seed was retrieved.  Hemostasis was excellent.  After irrigating the wound I placed 5 metal marker clips in the walls of the lumpectomy cavity.  I then closed the lumpectomy wound with interrupted sutures of 3-0 Vicryl and the skin with a running subcuticular 4-0 Monocryl and Dermabond.  Clean bandages and a breast binder were placed.  The patient tolerated the procedure well and was taken to PACU in stable condition.  EBL 10 cc.  Counts correct.  Complications none.    Addendum: I logged onto the PMP aware website and reviewed her prescription medication history     [name] M.  Dalbert Batman, M.D., FACS General and Minimally Invasive Surgery Breast and Colorectal Surgery  11/22/2018 11:38 AM

## 2018-11-25 ENCOUNTER — Encounter (HOSPITAL_BASED_OUTPATIENT_CLINIC_OR_DEPARTMENT_OTHER): Payer: Self-pay | Admitting: General Surgery

## 2018-11-26 NOTE — Progress Notes (Signed)
Inform patient of Pathology report,. Breast pathology shows atypical ductal hyperplasia. There is no cancer. She will not need any further surgery. I will discuss in detail at next office visit. Let me know that you reached her.  Thanks, Dalbert Batman

## 2019-03-19 ENCOUNTER — Encounter: Payer: Self-pay | Admitting: Podiatry

## 2019-03-19 ENCOUNTER — Ambulatory Visit (INDEPENDENT_AMBULATORY_CARE_PROVIDER_SITE_OTHER): Payer: BC Managed Care – PPO

## 2019-03-19 ENCOUNTER — Ambulatory Visit: Payer: BLUE CROSS/BLUE SHIELD | Admitting: Podiatry

## 2019-03-19 ENCOUNTER — Other Ambulatory Visit: Payer: Self-pay

## 2019-03-19 DIAGNOSIS — M7751 Other enthesopathy of right foot: Secondary | ICD-10-CM

## 2019-03-19 DIAGNOSIS — M21611 Bunion of right foot: Secondary | ICD-10-CM | POA: Diagnosis not present

## 2019-03-19 MED ORDER — MELOXICAM 15 MG PO TABS: 15.0000 mg | ORAL_TABLET | Freq: Every day | ORAL | 1 refills | Status: AC

## 2019-03-23 NOTE — Progress Notes (Signed)
   HPI: 58 y.o. female presenting today with a chief complaint of sharp pain noted to the right great toe that began about one week ago after playing pickle ball. She has been taking Advil and wearing different shoes for treatment but has had no improvement. Anything touching the area increases the pain. Patient is here for further evaluation and treatment.   Past Medical History:  Diagnosis Date  . Atrophy of vagina   . 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 Bacterial infection   . Chlamydia   . Dense breast   . Dyspareunia in female   . Endometriosis   . Herpes   . Herpes simplex   . High blood pressure   . History of chicken pox   . History of measles   . History of mumps   . Hypertensive disorder   . PONV (postoperative nausea and vomiting)   . Yeast infection      Physical Exam: General: The patient is alert and oriented x3 in no acute distress.  Dermatology: Skin is warm, dry and supple bilateral lower extremities. Negative for open lesions or macerations.  Vascular: Palpable pedal pulses bilaterally. No edema or erythema noted. Capillary refill within normal limits.  Neurological: Epicritic and protective threshold grossly intact bilaterally.   Musculoskeletal Exam: Pain with palpation noted to the 1st MPJ of the right foot. Range of motion within normal limits to all pedal and ankle joints bilateral. Muscle strength 5/5 in all groups bilateral.   Assessment: 1. 1st MPJ capsulitis right    Plan of Care:  1. Patient evaluated. X-Rays from PCP reviewed.  2. Injection of 0.5 mLs Celestone Soluspan injected into the 1st MPJ of the right foot.  3. Prescription for Meloxicam provided to patient. 4. Recommended open toe sandals.  5. Return to clinic in 3 weeks.       Edrick Kins, DPM [facility]  Dr. Edrick Kins, DPM    [address], [place] [address]                Office [phone]  Fax [phone]

## 2019-04-09 ENCOUNTER — Telehealth: Payer: Self-pay | Admitting: *Deleted

## 2019-04-09 ENCOUNTER — Ambulatory Visit (INDEPENDENT_AMBULATORY_CARE_PROVIDER_SITE_OTHER): Payer: BC Managed Care – PPO | Admitting: Podiatry

## 2019-04-09 ENCOUNTER — Other Ambulatory Visit: Payer: Self-pay

## 2019-04-09 DIAGNOSIS — G5791 Unspecified mononeuropathy of right lower limb: Secondary | ICD-10-CM | POA: Diagnosis not present

## 2019-04-09 DIAGNOSIS — M7751 Other enthesopathy of right foot: Secondary | ICD-10-CM | POA: Diagnosis not present

## 2019-04-09 MED ORDER — GABAPENTIN 100 MG PO CAPS: 100.0000 mg | ORAL_CAPSULE | Freq: Three times a day (TID) | ORAL | 0 refills | Status: DC

## 2019-04-09 NOTE — Telephone Encounter (Signed)
I called pt and asked if she had tried to rearrange the surgical shoe flap so it would not rub the area. Pt states she had, and it is now extremely loose. I told pt that she should come to the office and I would pad off the area so the shoe would not rub. I told pt that although she had been given a shoe to walk in she should be resting the area as well, performing only what was necessary in her day. Pt states understanding and will she how she feels tomorrow.

## 2019-04-09 NOTE — Telephone Encounter (Signed)
Pt called states she was seen in office today for a nerve irritation of the right MPJ and the boot she was given is irritating the foot also, is this a concern.

## 2019-04-10 NOTE — Telephone Encounter (Signed)
Pt presented to office for assistance in offloading the nerve along the right 1st toe medial. The flap over the medial portion of the right 1st toe was directly over the nerve, I lifted the flap and placed over the flap that came over from the lateral side of the surgical shoe and refastened. I had pt walk for a moment in the shoe and she states it did feel better. I gave pt a tongue pad and showed her how to cut through the length and place on either side of the nerve with the sticky side up and then place the medial flap over to attach. I told pt that I would be happy to assist again if necessary. Pt states understanding.

## 2019-04-13 NOTE — Progress Notes (Signed)
   HPI: 58 y.o. female presenting today for follow up evaluation of 1st MPJ capsulitis of the right foot. She reports mild improvement. She states the injection helped relieve the pain temporarily and she has been taking the Meloxicam but does not think it is helping much. Walking and being on the foot increases the pain. Patient is here for further evaluation and treatment.   Past Medical History:  Diagnosis Date  . Atrophy of vagina   . 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 Bacterial infection   . Chlamydia   . Dense breast   . Dyspareunia in female   . Endometriosis   . Herpes   . Herpes simplex   . High blood pressure   . History of chicken pox   . History of measles   . History of mumps   . Hypertensive disorder   . PONV (postoperative nausea and vomiting)   . Yeast infection      Physical Exam: General: The patient is alert and oriented x3 in no acute distress.  Dermatology: Skin is warm, dry and supple bilateral lower extremities. Negative for open lesions or macerations.  Vascular: Palpable pedal pulses bilaterally. No edema or erythema noted. Capillary refill within normal limits.  Neurological: Neuro: Epicritic and protective threshold intact.  Paresthesia with burning, shooting pain noted to the right great toe.    Musculoskeletal Exam: Pain with palpation noted to the 1st MPJ of the right foot. Range of motion within normal limits to all pedal and ankle joints bilateral. Muscle strength 5/5 in all groups bilateral.   Assessment: 1. 1st MPJ capsulitis right  2. Neuritis right great toe    Plan of Care:  1. Patient evaluated. 2. Continue taking Meloxicam.  3. Prescription for Gabapentin 100 mg TID provided to patient.  4. Post op shoe dispensed.  5. Return to clinic in 2 weeks.       Edrick Kins, DPM [facility]  Dr. Edrick Kins, DPM    [address], [place] [address]                 Office [phone]  Fax [phone]

## 2019-04-21 DIAGNOSIS — D0359 Melanoma in situ of other part of trunk: Secondary | ICD-10-CM | POA: Diagnosis not present

## 2019-04-21 DIAGNOSIS — L821 Other seborrheic keratosis: Secondary | ICD-10-CM | POA: Diagnosis not present

## 2019-04-21 DIAGNOSIS — L814 Other melanin hyperpigmentation: Secondary | ICD-10-CM | POA: Diagnosis not present

## 2019-04-21 DIAGNOSIS — D225 Melanocytic nevi of trunk: Secondary | ICD-10-CM | POA: Diagnosis not present

## 2019-04-21 DIAGNOSIS — L570 Actinic keratosis: Secondary | ICD-10-CM | POA: Diagnosis not present

## 2019-04-21 DIAGNOSIS — D485 Neoplasm of uncertain behavior of skin: Secondary | ICD-10-CM | POA: Diagnosis not present

## 2019-04-23 ENCOUNTER — Ambulatory Visit: Payer: BC Managed Care – PPO | Admitting: Podiatry

## 2019-04-23 ENCOUNTER — Encounter: Payer: Self-pay | Admitting: Podiatry

## 2019-04-23 ENCOUNTER — Other Ambulatory Visit: Payer: Self-pay

## 2019-04-23 DIAGNOSIS — M7751 Other enthesopathy of right foot: Secondary | ICD-10-CM | POA: Diagnosis not present

## 2019-04-23 DIAGNOSIS — G5791 Unspecified mononeuropathy of right lower limb: Secondary | ICD-10-CM | POA: Diagnosis not present

## 2019-04-25 ENCOUNTER — Other Ambulatory Visit: Payer: Self-pay | Admitting: Podiatry

## 2019-04-27 NOTE — Progress Notes (Signed)
   HPI: 58 y.o. female presenting today for follow up evaluation of 1st MPJ capsulitis of the right foot and neuritis of the right hallux. She states she is doing well and has improved significantly. She has been taking Gabapentin and Meloxicam as well as using the post op shoe as directed. There are no worsening factors noted. Patient is here for further evaluation and treatment.   Past Medical History:  Diagnosis Date  . Atrophy of vagina   . 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 Bacterial infection   . Chlamydia   . Dense breast   . Dyspareunia in female   . Endometriosis   . Herpes   . Herpes simplex   . High blood pressure   . History of chicken pox   . History of measles   . History of mumps   . Hypertensive disorder   . PONV (postoperative nausea and vomiting)   . Yeast infection      Physical Exam: General: The patient is alert and oriented x3 in no acute distress.  Dermatology: Skin is warm, dry and supple bilateral lower extremities. Negative for open lesions or macerations.  Vascular: Palpable pedal pulses bilaterally. No edema or erythema noted. Capillary refill within normal limits.  Neurological: Epicritic and protective threshold intact.     Musculoskeletal Exam: Range of motion within normal limits to all pedal and ankle joints bilateral. Muscle strength 5/5 in all groups bilateral.   Assessment: 1. 1st MPJ capsulitis right - resolved  2. Neuritis right great toe - resolved    Plan of Care:  1. Patient evaluated. 2. Transition out of post op shoe into good tennis shoes.  3. Slowly increase activity over the next three weeks.  4. Return to clinic as needed.       Edrick Kins, DPM [facility]  Dr. Edrick Kins, DPM    [address], [place] [address]                Office [phone]  Fax [phone]

## 2019-05-07 DIAGNOSIS — D0359 Melanoma in situ of other part of trunk: Secondary | ICD-10-CM | POA: Diagnosis not present

## 2019-05-12 ENCOUNTER — Other Ambulatory Visit: Payer: Self-pay | Admitting: Podiatry

## 2019-05-26 ENCOUNTER — Ambulatory Visit: Payer: BC Managed Care – PPO | Admitting: Family Medicine

## 2019-05-26 ENCOUNTER — Encounter: Payer: Self-pay | Admitting: Family Medicine

## 2019-05-26 ENCOUNTER — Ambulatory Visit: Payer: Self-pay

## 2019-05-26 ENCOUNTER — Other Ambulatory Visit: Payer: Self-pay

## 2019-05-26 VITALS — BP 140/70 | HR 71 | Ht 67.0 in | Wt 132.0 lb

## 2019-05-26 DIAGNOSIS — M79671 Pain in right foot: Secondary | ICD-10-CM

## 2019-05-26 DIAGNOSIS — M216X1 Other acquired deformities of right foot: Secondary | ICD-10-CM | POA: Diagnosis not present

## 2019-05-26 DIAGNOSIS — M216X9 Other acquired deformities of unspecified foot: Secondary | ICD-10-CM | POA: Insufficient documentation

## 2019-05-26 MED ORDER — VITAMIN D (ERGOCALCIFEROL) 1.25 MG (50000 UNIT) PO CAPS: 50000.0000 [IU] | ORAL_CAPSULE | ORAL | 0 refills | Status: DC

## 2019-05-26 NOTE — Patient Instructions (Addendum)
  [address], [address] [place], [place] [address] Phone [phone]  Good to see you Happy Holidays! Hoka arahi Do not lace last eye Once weekly vitamin D K2 200 mcg daily Avoid being barefoot Pennsaid small amount over most painful spot 2 times a day Follow up 3-4 weks

## 2019-05-26 NOTE — Progress Notes (Signed)
[facility] [address] [place], [place] [address] Phone: [phone] Subjective:   I Madeline Morris am serving as a Neurosurgeon for Dr. Antoine Primas.  This visit occurred during the SARS-CoV-2 public health emergency.  Safety protocols were in place, including screening questions prior to the visit, additional usage of staff PPE, and extensive cleaning of exam room while observing appropriate contact time as indicated for disinfecting solutions.   I'm seeing this patient by the request  of:  Madeline Dills, MD   CC: Foot pain  [id number]     Madeline Morris is a 58 y.o. female coming in with complaint of right foot pain. Patient seen by podiatry on 04/23/2019 for capsulitis. Patient states she does not recall the injury. States she played pickleball in her running shoes. Patient state the toe feels like an open womb and is very raw and TTP.   Onset- 3 months  Location - 1st toe  Duration-  Character- sharp Aggravating factors- depends on what shoe  Reliving factors-  Therapies tried- meloxicam, gabapentin  Severity-  4/10 at its worse     Past Medical History:  Diagnosis Date  . Atrophy of vagina   . 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 Bacterial infection   . Chlamydia   . Dense breast   . Dyspareunia in female   . Endometriosis   . Herpes   . Herpes simplex   . High blood pressure   . History of chicken pox   . History of measles   . History of mumps   . Hypertensive disorder   . PONV (postoperative nausea and vomiting)   . Yeast infection    Past Surgical History:  Procedure Laterality Date  . ABDOMINAL HYSTERECTOMY    . BREAST BIOPSY    . BREAST CYST ASPIRATION    . BREAST LUMPECTOMY WITH RADIOACTIVE SEED LOCALIZATION Left 11/22/2018   Procedure: LEFT BREAST LUMPECTOMY WITH RADIOACTIVE SEED LOCALIZATION;  Surgeon: Madeline Kelp, MD;  Location: [facility];  Service: General;  Laterality: Left;    . LAPAROSCOPY    . MYOMECTOMY    . TONSILECTOMY, ADENOIDECTOMY, BILATERAL MYRINGOTOMY AND TUBES    . VESICOVAGINAL FISTULA CLOSURE W/ TAH    . WISDOM TOOTH EXTRACTION     Social History   Socioeconomic History  . Marital status: Married    Spouse name: Not on file  . Number of children: Not on file  . Years of education: Not on file  . Highest education level: Not on file  Occupational History  . Not on file  Tobacco Use  . Smoking status: Never Smoker  . Smokeless tobacco: Never Used  Substance and Sexual Activity  . Alcohol use: Yes    Comment: Drinks 1 mix drink a day  . Drug use: No  . Sexual activity: Yes    Birth control/protection: Surgical, Post-menopausal    Comment: hyst  Other Topics Concern  . Not on file  Social History Narrative  . Not on file   Social Determinants of Health   Financial Resource Strain:   . Difficulty of Paying Living Expenses: Not on file  Food Insecurity:   . Worried About Programme researcher, broadcasting/film/video in the Last Year: Not on file  . Ran Out of Food in the Last Year: Not on file  Transportation Needs:   . Lack of Transportation (Medical): Not on file  . Lack of Transportation (Non-Medical): Not on file  Physical Activity:   . Days of Exercise per Week: Not on file  . Minutes of Exercise per Session: Not on file  Stress:   . Feeling of Stress : Not on file  Social Connections:   . Frequency of Communication with Friends and Family: Not on file  . Frequency of Social Gatherings with Friends and Family: Not on file  . Attends Religious Services: Not on file  . Active Member of Clubs or Organizations: Not on file  . Attends Archivist Meetings: Not on file  . Marital Status: Not on file   No Known Allergies Family History  Problem Relation Age of Onset  . Cancer Mother   . Hypertension Mother   . Cancer Father   . Hypertension Father   . Heart disease Maternal Grandmother   . Heart attack Maternal Grandmother   . Cancer  Paternal Grandmother   . Cancer Paternal Grandfather   . Breast cancer Neg Hx      Current Outpatient Medications (Cardiovascular):  .  losartan (COZAAR) 100 MG tablet, Take 1 tablet (100 mg total) by mouth daily. Marland Kitchen  triamterene-hydrochlorothiazide (MAXZIDE-25) 37.5-25 MG tablet, Take 1 tablet by mouth daily.    Current Outpatient Medications (Analgesics):  .  meloxicam (MOBIC) 15 MG tablet, Take 1 tablet (15 mg total) by mouth daily.   Current Outpatient Medications (Other):  Marland Kitchen  Cholecalciferol (VITAMIN D PO), Take 2,000 Units by mouth daily.  Marland Kitchen  gabapentin (NEURONTIN) 100 MG capsule, TAKE ONE CAPSULE BY MOUTH THREE TIMES A DAY .  Prasterone (INTRAROSA) 6.5 MG INST, Place 1 application vaginally daily. .  Rhubarb (ESTROVERA) 4 MG TABS, Take by mouth daily. .  Vitamin D, Ergocalciferol, (DRISDOL) 1.25 MG (50000 UT) CAPS capsule, Take 1 capsule (50,000 Units total) by mouth every 7 (seven) days.    Past medical history, social, surgical and family history all reviewed in electronic medical record.  No pertanent information unless stated regarding to the chief complaint.   Review of Systems:  No headache, visual changes, nausea, vomiting, diarrhea, constipation, dizziness, abdominal pain, skin rash, fevers, chills, night sweats, weight loss, swollen lymph nodes, body aches, joint swelling,  chest pain, shortness of breath, mood changes.  Positive muscle aches  Objective  Blood pressure 140/70, pulse 71, height 5\' 7"  (1.702 m), weight 132 lb (59.9 kg), SpO2 98 %. Systems examined below as of    General: No apparent distress alert and oriented x3 mood and affect normal, dressed appropriately.  HEENT: Pupils equal, extraocular movements intact  Respiratory: Patient's speak in full sentences and does not appear short of breath  Cardiovascular: No lower extremity edema, non tender, no erythema  Skin: Warm dry intact with no signs of infection or rash on extremities or on axial skeleton.   Abdomen: Soft nontender  Neuro: Cranial nerves II through XII are intact, neurovascularly intact in all extremities with 2+ DTRs and 2+ pulses.  Lymph: No lymphadenopathy of posterior or anterior cervical chain or axillae bilaterally.  Gait normal with good balance and coordination.  MSK:  Non tender with full range of motion and good stability and symmetric strength and tone of shoulders, elbows, wrist, hip, knee bilaterally.  Foot exam shows patient does have a rigid midfoot noted.  Patient does have severe breakdown of the transverse arch.  Bunion is somewhat tender to palpation medially.  Patient does have a bunionette on the contralateral side.  Patient's does have full range of motion though of the MTPs.  Limited  musculoskeletal ultrasound was performed and interpreted by Judi SaaZachary M Yaritzel Stange  Limited musculoskeletal ultrasound shows the patient does have a cortical irregularity of the bunion noted of the first MTP but no significant swelling of the first MTP.  Patient does have some mild bowing of the midfoot but otherwise fairly unremarkable.  [id number]; 15 additional minutes spent for Therapeutic exercises as stated in above notes.  This included exercises focusing on stretching, strengthening, with significant focus on eccentric aspects.   Long term goals include an improvement in range of motion, strength, endurance as well as avoiding reinjury. Patient's frequency would include in 1-2 times a day, 3-5 times a week for a duration of 6-12 weeks. Exercises for the foot include:  Stretches to help lengthen the lower leg and plantar fascia areas Theraband exercises for the lower leg and ankle to help strengthen the surrounding area- dorsiflexion, plantarflexion, inversion, eversion Massage rolling on the plantar surface of the foot with a frozen bottle, tennis ball or golf ball Towel or marble pick-ups to strengthen the plantar surface of the foot Weight bearing exercises to increase balance and  overall stability    Proper technique shown and discussed handout in great detail with ATC.  All questions were discussed and answered.     Impression and Recommendations:     This case required medical decision making of moderate complexity. The above documentation has been reviewed and is accurate and complete Judi SaaZachary M Keonna Raether, DO       Note: This dictation was prepared with Dragon dictation along with smaller phrase technology. Any transcriptional errors that result from this process are unintentional.

## 2019-05-26 NOTE — Assessment & Plan Note (Signed)
Breakdown the transverse arch.  Patient did have an irregularity noted of the bunion noted that could have been a potential avulsion fracture and that seems to be slow healing.  Discussed which activities to do which wants to avoid.  Patient is to increase activity slowly over the course the next several days again.  Discussed avoiding being barefoot, discussed over-the-counter orthotics with metatarsal pads that I think will be helpful.  Follow-up again in 4 weeks.

## 2019-05-27 ENCOUNTER — Encounter: Payer: Self-pay | Admitting: Family Medicine

## 2019-06-05 DIAGNOSIS — I10 Essential (primary) hypertension: Secondary | ICD-10-CM | POA: Diagnosis not present

## 2019-06-05 DIAGNOSIS — C439 Malignant melanoma of skin, unspecified: Secondary | ICD-10-CM | POA: Diagnosis not present

## 2019-06-09 DIAGNOSIS — I10 Essential (primary) hypertension: Secondary | ICD-10-CM | POA: Diagnosis not present

## 2019-06-19 ENCOUNTER — Ambulatory Visit (INDEPENDENT_AMBULATORY_CARE_PROVIDER_SITE_OTHER): Payer: BC Managed Care – PPO

## 2019-06-19 ENCOUNTER — Ambulatory Visit (INDEPENDENT_AMBULATORY_CARE_PROVIDER_SITE_OTHER): Payer: BC Managed Care – PPO | Admitting: Family Medicine

## 2019-06-19 ENCOUNTER — Encounter: Payer: Self-pay | Admitting: Family Medicine

## 2019-06-19 ENCOUNTER — Other Ambulatory Visit: Payer: Self-pay

## 2019-06-19 VITALS — BP 128/90 | HR 77 | Ht 67.0 in | Wt 133.0 lb

## 2019-06-19 DIAGNOSIS — M79671 Pain in right foot: Secondary | ICD-10-CM

## 2019-06-19 DIAGNOSIS — M216X1 Other acquired deformities of right foot: Secondary | ICD-10-CM | POA: Diagnosis not present

## 2019-06-19 NOTE — Assessment & Plan Note (Signed)
Patient is doing much better at this time.  Discussed posture and ergonomics, which activities to do which was to avoid.  Discussed icing regimen and home exercises, patient is going to continue to do the vitamin D and the K2 supplementation.  Patient will follow up with me again in 6 weeks to make sure improving completely.  Likely was secondary to an avulsion of the bunion of the toe.  Ultrasound shows healing well.

## 2019-06-19 NOTE — Patient Instructions (Signed)
Continue vitamin d and K2 Can start running in 2 weeks Continue orthotic in regular shoes OOFOS recovery sandals [place] carbon x running shoes See me again in 6-+8 weeks to make sure completely healed

## 2019-06-19 NOTE — Progress Notes (Signed)
[facility] [address] [place] [address] Phone: [phone] Subjective:   Madeline Morris, am serving as a scribe for Dr. Antoine Primas. This visit occurred during the SARS-CoV-2 public health emergency.  Safety protocols were in place, including screening questions prior to the visit, additional usage of staff PPE, and extensive cleaning of exam room while observing appropriate contact time as indicated for disinfecting solutions.   I  CC: Foot pain follow-up  [id number]   05/26/2019 Breakdown the transverse arch.  Patient did have an irregularity noted of the bunion noted that could have been a potential avulsion fracture and that seems to be slow healing.  Discussed which activities to do which wants to avoid.  Patient is to increase activity slowly over the course the next several days again.  Discussed avoiding being barefoot, discussed over-the-counter orthotics with metatarsal pads that I think will be helpful.  Follow-up again in 4 weeks.  Update 06/19/2019 Madeline Morris is a 59 y.o. female coming in with complaint of right foot pain. Patient states that she has been doing well since last visit. If she wears a shoe that put pressure on her foot her pain will increase.  Patient states 70% better.  Has not been running at this time but entire visit patient's goal is to get running very soon.      Past Medical History:  Diagnosis Date  . Atrophy of vagina   . 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 Bacterial infection   . Chlamydia   . Dense breast   . Dyspareunia in female   . Endometriosis   . Herpes   . Herpes simplex   . High blood pressure   . History of chicken pox   . History of measles   . History of mumps   . Hypertensive disorder   . PONV (postoperative nausea and vomiting)   . Yeast infection    Past Surgical History:  Procedure Laterality Date  . ABDOMINAL HYSTERECTOMY    . BREAST BIOPSY    .  BREAST CYST ASPIRATION    . BREAST LUMPECTOMY WITH RADIOACTIVE SEED LOCALIZATION Left 11/22/2018   Procedure: LEFT BREAST LUMPECTOMY WITH RADIOACTIVE SEED LOCALIZATION;  Surgeon: Claud Kelp, MD;  Location: [facility];  Service: General;  Laterality: Left;  . LAPAROSCOPY    . MYOMECTOMY    . TONSILECTOMY, ADENOIDECTOMY, BILATERAL MYRINGOTOMY AND TUBES    . VESICOVAGINAL FISTULA CLOSURE W/ TAH    . WISDOM TOOTH EXTRACTION     Social History   Socioeconomic History  . Marital status: Married    Spouse name: Not on file  . Number of children: Not on file  . Years of education: Not on file  . Highest education level: Not on file  Occupational History  . Not on file  Tobacco Use  . Smoking status: Never Smoker  . Smokeless tobacco: Never Used  Substance and Sexual Activity  . Alcohol use: Yes    Comment: Drinks 1 mix drink a day  . Drug use: No  . Sexual activity: Yes    Birth control/protection: Surgical, Post-menopausal    Comment: hyst  Other Topics Concern  . Not on file  Social History Narrative  . Not on file   Social Determinants of Health   Financial Resource Strain:   . Difficulty of Paying Living Expenses: Not on file  Food Insecurity:   . Worried About Programme researcher, broadcasting/film/video in  the Last Year: Not on file  . Ran Out of Food in the Last Year: Not on file  Transportation Needs:   . Lack of Transportation (Medical): Not on file  . Lack of Transportation (Non-Medical): Not on file  Physical Activity:   . Days of Exercise per Week: Not on file  . Minutes of Exercise per Session: Not on file  Stress:   . Feeling of Stress : Not on file  Social Connections:   . Frequency of Communication with Friends and Family: Not on file  . Frequency of Social Gatherings with Friends and Family: Not on file  . Attends Religious Services: Not on file  . Active Member of Clubs or Organizations: Not on file  . Attends Archivist Meetings: Not on file  .  Marital Status: Not on file   No Known Allergies Family History  Problem Relation Age of Onset  . Cancer Mother   . Hypertension Mother   . Cancer Father   . Hypertension Father   . Heart disease Maternal Grandmother   . Heart attack Maternal Grandmother   . Cancer Paternal Grandmother   . Cancer Paternal Grandfather   . Breast cancer Neg Hx      Current Outpatient Medications (Cardiovascular):  .  losartan (COZAAR) 100 MG tablet, Take 1 tablet (100 mg total) by mouth daily. Marland Kitchen  triamterene-hydrochlorothiazide (MAXZIDE-25) 37.5-25 MG tablet, Take 1 tablet by mouth daily.    Current Outpatient Medications (Analgesics):  .  meloxicam (MOBIC) 15 MG tablet, Take 1 tablet (15 mg total) by mouth daily.   Current Outpatient Medications (Other):  Marland Kitchen  Cholecalciferol (VITAMIN D PO), Take 2,000 Units by mouth daily.  Marland Kitchen  gabapentin (NEURONTIN) 100 MG capsule, TAKE ONE CAPSULE BY MOUTH THREE TIMES A DAY .  Prasterone (INTRAROSA) 6.5 MG INST, Place 1 application vaginally daily. .  Rhubarb (ESTROVERA) 4 MG TABS, Take by mouth daily. .  Vitamin D, Ergocalciferol, (DRISDOL) 1.25 MG (50000 UT) CAPS capsule, Take 1 capsule (50,000 Units total) by mouth every 7 (seven) days.    Past medical history, social, surgical and family history all reviewed in electronic medical record.  No pertanent information unless stated regarding to the chief complaint.   Review of Systems:  No headache, visual changes, nausea, vomiting, diarrhea, constipation, dizziness, abdominal pain, skin rash, fevers, chills, night sweats, weight loss, swollen lymph nodes, body aches, joint swelling, muscle aches, chest pain, shortness of breath, mood changes.   Objective  Blood pressure 128/90, pulse 77, height 5\' 7"  (1.702 m), weight 133 lb (60.3 kg), SpO2 98 %. Systems examined below as of    General: No apparent distress alert and oriented x3 mood and affect normal, dressed appropriately.  HEENT: Pupils equal,  extraocular movements intact  Respiratory: Patient's speak in full sentences and does not appear short of breath  Cardiovascular: No lower extremity edema, non tender, no erythema  Skin: Warm dry intact with no signs of infection or rash on extremities or on axial skeleton.  Abdomen: Soft nontender  Neuro: Cranial nerves II through XII are intact, neurovascularly intact in all extremities with 2+ DTRs and 2+ pulses.  Lymph: No lymphadenopathy of posterior or anterior cervical chain or axillae bilaterally.  Gait normal with good balance and coordination.  MSK:  Non tender with full range of motion and good stability and symmetric strength and tone of shoulders, elbows, wrist, hip, knee bilaterally.  Foot exam still shows a significant breakdown of the transverse arch.  Patient does have a bunion and bunionette formation noted.  Still minorly tender but not as significant.  Improvement in range of motion of the first metatarsal noted.  Full range of motion of the ankle noted.  Limited musculoskeletal ultrasound was performed and interpreted by Judi Saa Limited ultrasound of patient's right first toe shows that there is no synovitis still noted at this time.  Patient's cortical irregularity that was noted previously does have callus formation likely contributing to the diagnosis of a healing fracture. Impression: Likely small fracture that was an avulsion on the bunion of the first toe on the right foot interval healing    Impression and Recommendations:     This case required medical decision making of moderate complexity. The above documentation has been reviewed and is accurate and complete Judi Saa, DO       Note: This dictation was prepared with Dragon dictation along with smaller phrase technology. Any transcriptional errors that result from this process are unintentional.

## 2019-08-07 ENCOUNTER — Encounter: Payer: Self-pay | Admitting: Family Medicine

## 2019-08-07 ENCOUNTER — Other Ambulatory Visit: Payer: Self-pay

## 2019-08-07 ENCOUNTER — Ambulatory Visit: Payer: BC Managed Care – PPO | Admitting: Family Medicine

## 2019-08-07 DIAGNOSIS — M216X1 Other acquired deformities of right foot: Secondary | ICD-10-CM

## 2019-08-07 MED ORDER — VITAMIN D (ERGOCALCIFEROL) 1.25 MG (50000 UNIT) PO CAPS: 50000.0000 [IU] | ORAL_CAPSULE | ORAL | 0 refills | Status: DC

## 2019-08-07 NOTE — Assessment & Plan Note (Signed)
Patient is doing significantly better at this time.  Continue the over-the-counter orthotics, continue the vitamin D if it seems to be beneficial, as long as patient does well patient will follow-up as needed

## 2019-08-07 NOTE — Patient Instructions (Addendum)
Good to see you Looks great Consider [employer]ias shoes  See me as needed

## 2019-08-07 NOTE — Progress Notes (Signed)
Madeline Morris [address] [place] [address] Phone: [phone] Subjective:   I [name] am serving as a Neurosurgeon for Dr. Antoine Primas.  This visit occurred during the SARS-CoV-2 public health emergency.  Safety protocols were in place, including screening questions prior to the visit, additional usage of staff PPE, and extensive cleaning of exam room while observing appropriate contact time as indicated for disinfecting solutions.    I'm seeing this patient by the request  of:  Renford Dills, MD  CC: Foot pain follow-up  [id number]   06/19/2019 Patient is doing much better at this time.  Discussed posture and ergonomics, which activities to do which was to avoid.  Discussed icing regimen and home exercises, patient is going to continue to do the vitamin D and the K2 supplementation.  Patient will follow up with me again in 6 weeks to make sure improving completely.  Likely was secondary to an avulsion of the bunion of the toe.  Ultrasound shows healing well.  Update 08/07/2019 Madeline Morris is a 59 y.o. female coming in with complaint of right foot pain. Patient states her foot is doing well. Still having some issues depending on what shoes she wears.  Patient would state that she feels 98% better.  Nothing significant at the moment.  Has been able to do daily activities without any concern at all.     Past Medical History:  Diagnosis Date  . Atrophy of vagina   . 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 Bacterial infection   . Chlamydia   . Dense breast   . Dyspareunia in female   . Endometriosis   . Herpes   . Herpes simplex   . High blood pressure   . History of chicken pox   . History of measles   . History of mumps   . Hypertensive disorder   . PONV (postoperative nausea and vomiting)   . Yeast infection    Past Surgical History:  Procedure Laterality Date  . ABDOMINAL HYSTERECTOMY    . BREAST BIOPSY    .  BREAST CYST ASPIRATION    . BREAST LUMPECTOMY WITH RADIOACTIVE SEED LOCALIZATION Left 11/22/2018   Procedure: LEFT BREAST LUMPECTOMY WITH RADIOACTIVE SEED LOCALIZATION;  Surgeon: Claud Kelp, MD;  Location: [facility];  Service: General;  Laterality: Left;  . LAPAROSCOPY    . MYOMECTOMY    . TONSILECTOMY, ADENOIDECTOMY, BILATERAL MYRINGOTOMY AND TUBES    . VESICOVAGINAL FISTULA CLOSURE W/ TAH    . WISDOM TOOTH EXTRACTION     Social History   Socioeconomic History  . Marital status: Married    Spouse name: Not on file  . Number of children: Not on file  . Years of education: Not on file  . Highest education level: Not on file  Occupational History  . Not on file  Tobacco Use  . Smoking status: Never Smoker  . Smokeless tobacco: Never Used  Substance and Sexual Activity  . Alcohol use: Yes    Comment: Drinks 1 mix drink a day  . Drug use: No  . Sexual activity: Yes    Birth control/protection: Surgical, Post-menopausal    Comment: hyst  Other Topics Concern  . Not on file  Social History Narrative  . Not on file   Social Determinants of Health   Financial Resource Strain:   . Difficulty of Paying Living Expenses: Not on file  Food Insecurity:   . Worried  About Running Out of Food in the Last Year: Not on file  . Ran Out of Food in the Last Year: Not on file  Transportation Needs:   . Lack of Transportation (Medical): Not on file  . Lack of Transportation (Non-Medical): Not on file  Physical Activity:   . Days of Exercise per Week: Not on file  . Minutes of Exercise per Session: Not on file  Stress:   . Feeling of Stress : Not on file  Social Connections:   . Frequency of Communication with Friends and Family: Not on file  . Frequency of Social Gatherings with Friends and Family: Not on file  . Attends Religious Services: Not on file  . Active Member of Clubs or Organizations: Not on file  . Attends Banker Meetings: Not on file  .  Marital Status: Not on file   No Known Allergies Family History  Problem Relation Age of Onset  . Cancer Mother   . Hypertension Mother   . Cancer Father   . Hypertension Father   . Heart disease Maternal Grandmother   . Heart attack Maternal Grandmother   . Cancer Paternal Grandmother   . Cancer Paternal Grandfather   . Breast cancer Neg Hx      Current Outpatient Medications (Cardiovascular):  .  losartan (COZAAR) 100 MG tablet, Take 1 tablet (100 mg total) by mouth daily. Marland Kitchen  triamterene-hydrochlorothiazide (MAXZIDE-25) 37.5-25 MG tablet, Take 1 tablet by mouth daily.    Current Outpatient Medications (Analgesics):  .  meloxicam (MOBIC) 15 MG tablet, Take 1 tablet (15 mg total) by mouth daily.   Current Outpatient Medications (Other):  Marland Kitchen  Cholecalciferol (VITAMIN D PO), Take 2,000 Units by mouth daily.  Marland Kitchen  gabapentin (NEURONTIN) 100 MG capsule, TAKE ONE CAPSULE BY MOUTH THREE TIMES A DAY .  Prasterone (INTRAROSA) 6.5 MG INST, Place 1 application vaginally daily. .  Rhubarb (ESTROVERA) 4 MG TABS, Take by mouth daily. .  Vitamin D, Ergocalciferol, (DRISDOL) 1.25 MG (50000 UT) CAPS capsule, Take 1 capsule (50,000 Units total) by mouth every 7 (seven) days. .  Vitamin D, Ergocalciferol, (DRISDOL) 1.25 MG (50000 UNIT) CAPS capsule, Take 1 capsule (50,000 Units total) by mouth every 7 (seven) days.   Reviewed prior external information including notes and imaging from  primary care provider As well as notes that were available from care everywhere and other healthcare systems.  Past medical history, social, surgical and family history all reviewed in electronic medical record.  No pertanent information unless stated regarding to the chief complaint.   Review of Systems:  No headache, visual changes, nausea, vomiting, diarrhea, constipation, dizziness, abdominal pain, skin rash, fevers, chills, night sweats, weight loss, swollen lymph nodes, body aches, joint swelling, chest  pain, shortness of breath, mood changes. POSITIVE muscle aches  Objective  Blood pressure 120/90, pulse 62, height 5\' 7"  (1.702 m), weight 132 lb (59.9 kg), SpO2 99 %.   General: No apparent distress alert and oriented x3 mood and affect normal, dressed appropriately.  HEENT: Pupils equal, extraocular movements intact  Respiratory: Patient's speak in full sentences and does not appear short of breath  Cardiovascular: No lower extremity edema, non tender, no erythema  Skin: Warm dry intact with no signs of infection or rash on extremities or on axial skeleton.  Abdomen: Soft nontender  Neuro: Cranial nerves II through XII are intact, neurovascularly intact in all extremities with 2+ DTRs and 2+ pulses.  Lymph: No lymphadenopathy of posterior or anterior  cervical chain or axillae bilaterally.  Gait normal with good balance and coordination.  MSK:  Non tender with full range of motion and good stability and symmetric strength and tone of shoulders, elbows, wrist, hip, knee and ankles bilaterally.  Foot exam does show a breakdown of the transverse arch, bunion and bunionette formation noted.  Nontender on exam with full range of motion of the first MTP   Impression and Recommendations:     The above documentation has been reviewed and is accurate and complete Lyndal Pulley, DO       Note: This dictation was prepared with Dragon dictation along with smaller phrase technology. Any transcriptional errors that result from this process are unintentional.

## 2019-08-25 NOTE — Progress Notes (Signed)
Virtual Visit via Video Note   This visit type was conducted due to national recommendations for restrictions regarding the COVID-19 Pandemic (e.g. social distancing) in an effort to limit this patient's exposure and mitigate transmission in our community.  Due to her co-morbid illnesses, this patient is at least at moderate risk for complications without adequate follow up.  This format is felt to be most appropriate for this patient at this time.  All issues noted in this document were discussed and addressed.  A limited physical exam was performed with this format.  Please refer to the patient's chart for her consent to telehealth for [facility].  Evaluation Performed:  Follow-up visit  This visit type was conducted due to national recommendations for restrictions regarding the COVID-19 Pandemic (e.g. social distancing).  This format is felt to be most appropriate for this patient at this time.  All issues noted in this document were discussed and addressed.  No physical exam was performed (except for noted visual exam findings with Video Visits).  Please refer to the patient's chart (MyChart message for video visits and phone note for telephone visits) for the patient's consent to telehealth for [facility]  Date:  08/26/2019   ID:  Madeline Morris, DOB 04/23/1961, MRN [id number]  Patient Location:  [address] [address]   Provider location:      [address] Office [phone] Fax [phone]   PCP:  Seward Carol, MD  Cardiologist:  Skeet Latch, MD  Electrophysiologist:  None   Chief Complaint: Follow-up  History of Present Illness:    Madeline Morris is a 59 y.o. female who presents via audio/video conferencing for a telehealth visit today.  Patient verified DOB and address.  The patient does not symptoms concerning for COVID-19 infection (fever, chills, cough, or new SHORTNESS OF  BREATH).   Madeline Morris has a past medical history of hypertension.  She was initially seen by Dr. Oval Linsey 7/18 for hypertension and risk assessment prior to starting her HRT.  At that time her blood pressure was poorly controlled and her losartan was increased.  She did report some exertional dyspnea and was referred for an exercise tolerance test.  She was noted to have some baseline EKG changes.  However she did not have horizontal ST depression.  She also achieved 11.7 METS on a Bruce protocol.  She was then referred for coronary CTA that showed a calcium score of 0 and no coronary disease.  She had carotid Dopplers 4/15 that showed mild bilateral intimal thickening with no plaque.  She was last seen by Dr. Oval Linsey 9/18.  At that time she was doing well.  She was monitoring her blood pressure regularly and noted that it was in the 120s over 80s.  She continued to exercise regularly, had no chest pain, shortness of breath, lower extremity edema, orthopnea, or PND.  She is seen virtually today in follow-up and states she feels well today.  She had a foot fracture in the winter which she has recovered from.  This has limited her physical activity over the winter.  She is in the process of increasing her physical activity and is currently walking 3 miles daily.  She also is using weights several times per week.  Her blood pressure has been well controlled.  She states that she has discontinued hormone replacement therapy due to a breast cancer scare.  She has noticed that since  discontinuing her HRT she is not sleeping as well she was prior.  I will give her the salty 6 information sheet and have her follow-up in 1 year.  Today she denies chest pain, shortness of breath, lower extremity edema, fatigue, palpitations, melena, hematuria, hemoptysis, diaphoresis, weakness, presyncope, syncope, orthopnea, and PND.   Prior CV studies:   The following studies were reviewed today:  ETT 01/04/2017  Blood  pressure demonstrated a hypertensive response to exercise.  Horizontal ST segment depression ST segment depression of 2 mm was noted during stress in the II, III, V5, V6 and aVF leads.  T wave inversion was noted during stress.   Baseline ECG shows LVH and prominent ST depression, much more obvious compared to previous ECG on file. There is no additional ST segment depression during exercise. Good exercise capacity. Study specificity is limited by the presence of baseline ST segment abnormalities. Consider imaging studies if there is high suspicion for CAD.  Coronary CTA 01/26/2017 IMPRESSION: 1) Calcium score 0  2) Normal right dominant coronary arteries  3) Normal aortic root 3.5 cm  Past Medical History:  Diagnosis Date  . Atrophy of vagina   . 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 Bacterial infection   . Chlamydia   . Dense breast   . Dyspareunia in female   . Endometriosis   . Herpes   . Herpes simplex   . High blood pressure   . History of chicken pox   . History of measles   . History of mumps   . Hypertensive disorder   . PONV (postoperative nausea and vomiting)   . Yeast infection    Past Surgical History:  Procedure Laterality Date  . ABDOMINAL HYSTERECTOMY    . BREAST BIOPSY    . BREAST CYST ASPIRATION    . BREAST LUMPECTOMY WITH RADIOACTIVE SEED LOCALIZATION Left 11/22/2018   Procedure: LEFT BREAST LUMPECTOMY WITH RADIOACTIVE SEED LOCALIZATION;  Surgeon: Claud Kelp, MD;  Location: [facility];  Service: General;  Laterality: Left;  . LAPAROSCOPY    . MYOMECTOMY    . TONSILECTOMY, ADENOIDECTOMY, BILATERAL MYRINGOTOMY AND TUBES    . VESICOVAGINAL FISTULA CLOSURE W/ TAH    . WISDOM TOOTH EXTRACTION       Current Meds  Medication Sig  . losartan (COZAAR) 100 MG tablet Take 1 tablet (100 mg total) by mouth daily.  . rosuvastatin (CRESTOR) 5 MG tablet Take 5 mg by mouth daily.  Marland Kitchen triamterene-hydrochlorothiazide  (MAXZIDE-25) 37.5-25 MG tablet Take 1 tablet by mouth daily.   . Vitamin D, Ergocalciferol, (DRISDOL) 1.25 MG (50000 UNIT) CAPS capsule Take 1 capsule (50,000 Units total) by mouth every 7 (seven) days.     Allergies:   Patient has no known allergies.   Social History   Tobacco Use  . Smoking status: Never Smoker  . Smokeless tobacco: Never Used  Substance Use Topics  . Alcohol use: Yes    Comment: Drinks 1 mix drink a day  . Drug use: No     Family Hx: The patient's family history includes Cancer in her father, mother, paternal grandfather, and paternal grandmother; Heart attack in her maternal grandmother; Heart disease in her maternal grandmother; Hypertension in her father and mother. There is no history of Breast cancer.  ROS:   Please see the history of present illness.     All other systems reviewed and are negative.   Labs/Other Tests and Data Reviewed:    Recent Labs: 11/18/2018:  ALT 15; BUN 10; Creatinine, Ser 0.78; Hemoglobin 13.9; Platelets 288; Potassium 3.7; Sodium 136   Recent Lipid Panel No results found for: CHOL, TRIG, HDL, CHOLHDL, LDLCALC, LDLDIRECT  Wt Readings from Last 3 Encounters:  08/26/19 135 lb (61.2 kg)  08/07/19 132 lb (59.9 kg)  06/19/19 133 lb (60.3 kg)     Exam:    Vital Signs:  BP 134/86   Pulse 75   Ht 5\' 7"  (1.702 m)   Wt 135 lb (61.2 kg)   BMI 21.14 kg/m    Well nourished, well developed female in no  acute distress.   ASSESSMENT & PLAN:    1.  Essential hypertension-BP today 134/86.  Well-controlled at home. Continue losartan 100 mg tablet daily Maxide 37.5-25 daily Heart healthy low-sodium diet-salty 6 given Increase physical activity as tolerated  DOE-continues to exercise regularly no further increased dyspnea with normal activities.  Exercise tolerance test was noted to have some baseline EKG changes.  However she did not have horizontal ST depression  7/18.  Coronary CTA showed normal coronaries and calcium score of  0  8/18. Heart healthy low-sodium diet Increase physical activity as tolerated-goal 150 minutes of mild to moderate physical activity weekly  Hyperlipidemia-LDL 154 06/09/2019. Continue Crestor 5 mg tablet daily Heart healthy low-sodium high-fiber diet Increase physical activity as tolerated Followed by PCP  Disposition: Follow-up with Dr. 06/11/2019 in 1 year.  COVID-19 Education: The signs and symptoms of COVID-19 were discussed with the patient and how to seek care for testing (follow up with PCP or arrange E-visit).  The importance of social distancing was discussed today.  Patient Risk:   After full review of this patients clinical status, I feel that they are at least moderate risk at this time.  Time:   Today, I have spent 15 minutes with the patient with telehealth technology discussing diet, exercise, medications, cholesterol.     Medication Adjustments/Labs and Tests Ordered: Current medicines are reviewed at length with the patient today.  Concerns regarding medicines are outlined above.   Tests Ordered: No orders of the defined types were placed in this encounter.  Medication Changes: No orders of the defined types were placed in this encounter.   Disposition:  in 1 year(s)  Signed, Duke Salvia. Leticia Mcdiarmid NP-C       [facility] [address] Office [phone] Fax [phone]

## 2019-08-26 ENCOUNTER — Telehealth (INDEPENDENT_AMBULATORY_CARE_PROVIDER_SITE_OTHER): Payer: BC Managed Care – PPO | Admitting: General Practice

## 2019-08-26 ENCOUNTER — Encounter: Payer: Self-pay | Admitting: General Practice

## 2019-08-26 VITALS — BP 134/86 | HR 75 | Ht 67.0 in | Wt 135.0 lb

## 2019-08-26 DIAGNOSIS — I10 Essential (primary) hypertension: Secondary | ICD-10-CM | POA: Diagnosis not present

## 2019-08-26 DIAGNOSIS — R0609 Other forms of dyspnea: Secondary | ICD-10-CM

## 2019-08-26 DIAGNOSIS — E7800 Pure hypercholesterolemia, unspecified: Secondary | ICD-10-CM

## 2019-08-26 DIAGNOSIS — R0600 Dyspnea, unspecified: Secondary | ICD-10-CM

## 2019-08-26 NOTE — Patient Instructions (Signed)
Medication Instructions:  The current medical regimen is effective;  continue present plan and medications as directed. Please refer to the Current Medication list given to you today. If you need a refill on your cardiac medications before your next appointment, please call your pharmacy.  Special Instructions: CONTINUE PHYSICAL ACTIVITY AS DISCUSSED  PLEASE READ AND FOLLOW SALTY 6 ATTACHED  Follow-Up: 12 months Please call our office 2 months in advance, JAN 2022 to schedule this MAR 2022 appointment. In Person You may see Chilton Si, MD or one of the following Advanced Practice Providers on your designated Care Team:  Edd Fabian, FNP  Corine Shelter, PA-C  [place], [place].    At [facility], you and your health needs are our priority.  As part of our continuing mission to provide you with exceptional heart care, we have created designated Provider Care Teams.  These Care Teams include your primary Cardiologist (physician) and Advanced Practice Providers (APPs -  Physician Assistants and Nurse Practitioners) who all work together to provide you with the care you need, when you need it.  Reduce your risk of getting COVID-19 With your heart disease it is especially important for people at increased risk of severe illness from COVID-19, and those who live with them, to protect themselves from getting COVID-19. The best way to protect yourself and to help reduce the spread of the virus that causes COVID-19 is to: Marland Kitchen Limit your interactions with other people as much as possible. . Take COVID-19 when you do interact with others. If you start feeling sick and think you may have COVID-19, get in touch with your healthcare provider within 24 hours.  Thank you for choosing [facility] at [facility]!!

## 2019-09-17 ENCOUNTER — Ambulatory Visit: Payer: BC Managed Care – PPO

## 2019-10-07 ENCOUNTER — Other Ambulatory Visit: Payer: Self-pay | Admitting: Obstetrics and Gynecology

## 2019-10-07 DIAGNOSIS — Z1231 Encounter for screening mammogram for malignant neoplasm of breast: Secondary | ICD-10-CM

## 2019-10-08 DIAGNOSIS — N941 Unspecified dyspareunia: Secondary | ICD-10-CM | POA: Diagnosis not present

## 2019-10-08 DIAGNOSIS — Z6821 Body mass index (BMI) 21.0-21.9, adult: Secondary | ICD-10-CM | POA: Diagnosis not present

## 2019-10-08 DIAGNOSIS — Z01419 Encounter for gynecological examination (general) (routine) without abnormal findings: Secondary | ICD-10-CM | POA: Diagnosis not present

## 2019-10-09 DIAGNOSIS — C439 Malignant melanoma of skin, unspecified: Secondary | ICD-10-CM | POA: Diagnosis not present

## 2019-10-09 DIAGNOSIS — H8111 Benign paroxysmal vertigo, right ear: Secondary | ICD-10-CM | POA: Diagnosis not present

## 2019-10-09 DIAGNOSIS — I10 Essential (primary) hypertension: Secondary | ICD-10-CM | POA: Diagnosis not present

## 2019-10-09 DIAGNOSIS — E7800 Pure hypercholesterolemia, unspecified: Secondary | ICD-10-CM | POA: Diagnosis not present

## 2019-10-22 ENCOUNTER — Ambulatory Visit
Admission: RE | Admit: 2019-10-22 | Discharge: 2019-10-22 | Disposition: A | Discharge status: Home / Self Care | Payer: BC Managed Care – PPO | Source: Ambulatory Visit | Attending: Obstetrics and Gynecology | Admitting: Obstetrics and Gynecology

## 2019-10-22 ENCOUNTER — Other Ambulatory Visit: Payer: Self-pay

## 2019-10-22 DIAGNOSIS — Z1231 Encounter for screening mammogram for malignant neoplasm of breast: Secondary | ICD-10-CM | POA: Diagnosis not present

## 2019-11-13 DIAGNOSIS — L578 Other skin changes due to chronic exposure to nonionizing radiation: Secondary | ICD-10-CM | POA: Diagnosis not present

## 2019-11-13 DIAGNOSIS — L821 Other seborrheic keratosis: Secondary | ICD-10-CM | POA: Diagnosis not present

## 2019-11-13 DIAGNOSIS — L570 Actinic keratosis: Secondary | ICD-10-CM | POA: Diagnosis not present

## 2019-11-13 DIAGNOSIS — L814 Other melanin hyperpigmentation: Secondary | ICD-10-CM | POA: Diagnosis not present

## 2019-11-13 DIAGNOSIS — D225 Melanocytic nevi of trunk: Secondary | ICD-10-CM | POA: Diagnosis not present

## 2019-11-26 DIAGNOSIS — T63301A Toxic effect of unspecified spider venom, accidental (unintentional), initial encounter: Secondary | ICD-10-CM | POA: Diagnosis not present

## 2019-12-13 ENCOUNTER — Other Ambulatory Visit: Payer: Self-pay | Admitting: Podiatry

## 2020-01-31 IMAGING — DX BREAST SURGICAL SPECIMEN
1 series · 2 of 2 positions shown · non-contrast
Comparison: Previous exam(s).

CLINICAL DATA: Status post radioactive seed localized excision of
ribbon shaped clip in the LEFT breast. Recent biopsy showed atypical
ductal hyperplasia and flat epithelial atypia.

EXAM:
SPECIMEN RADIOGRAPH OF THE LEFT BREAST

[Series 2: specimen digital x-ray, derived · left · 2 of 2 slices shown]
[im 1/2]
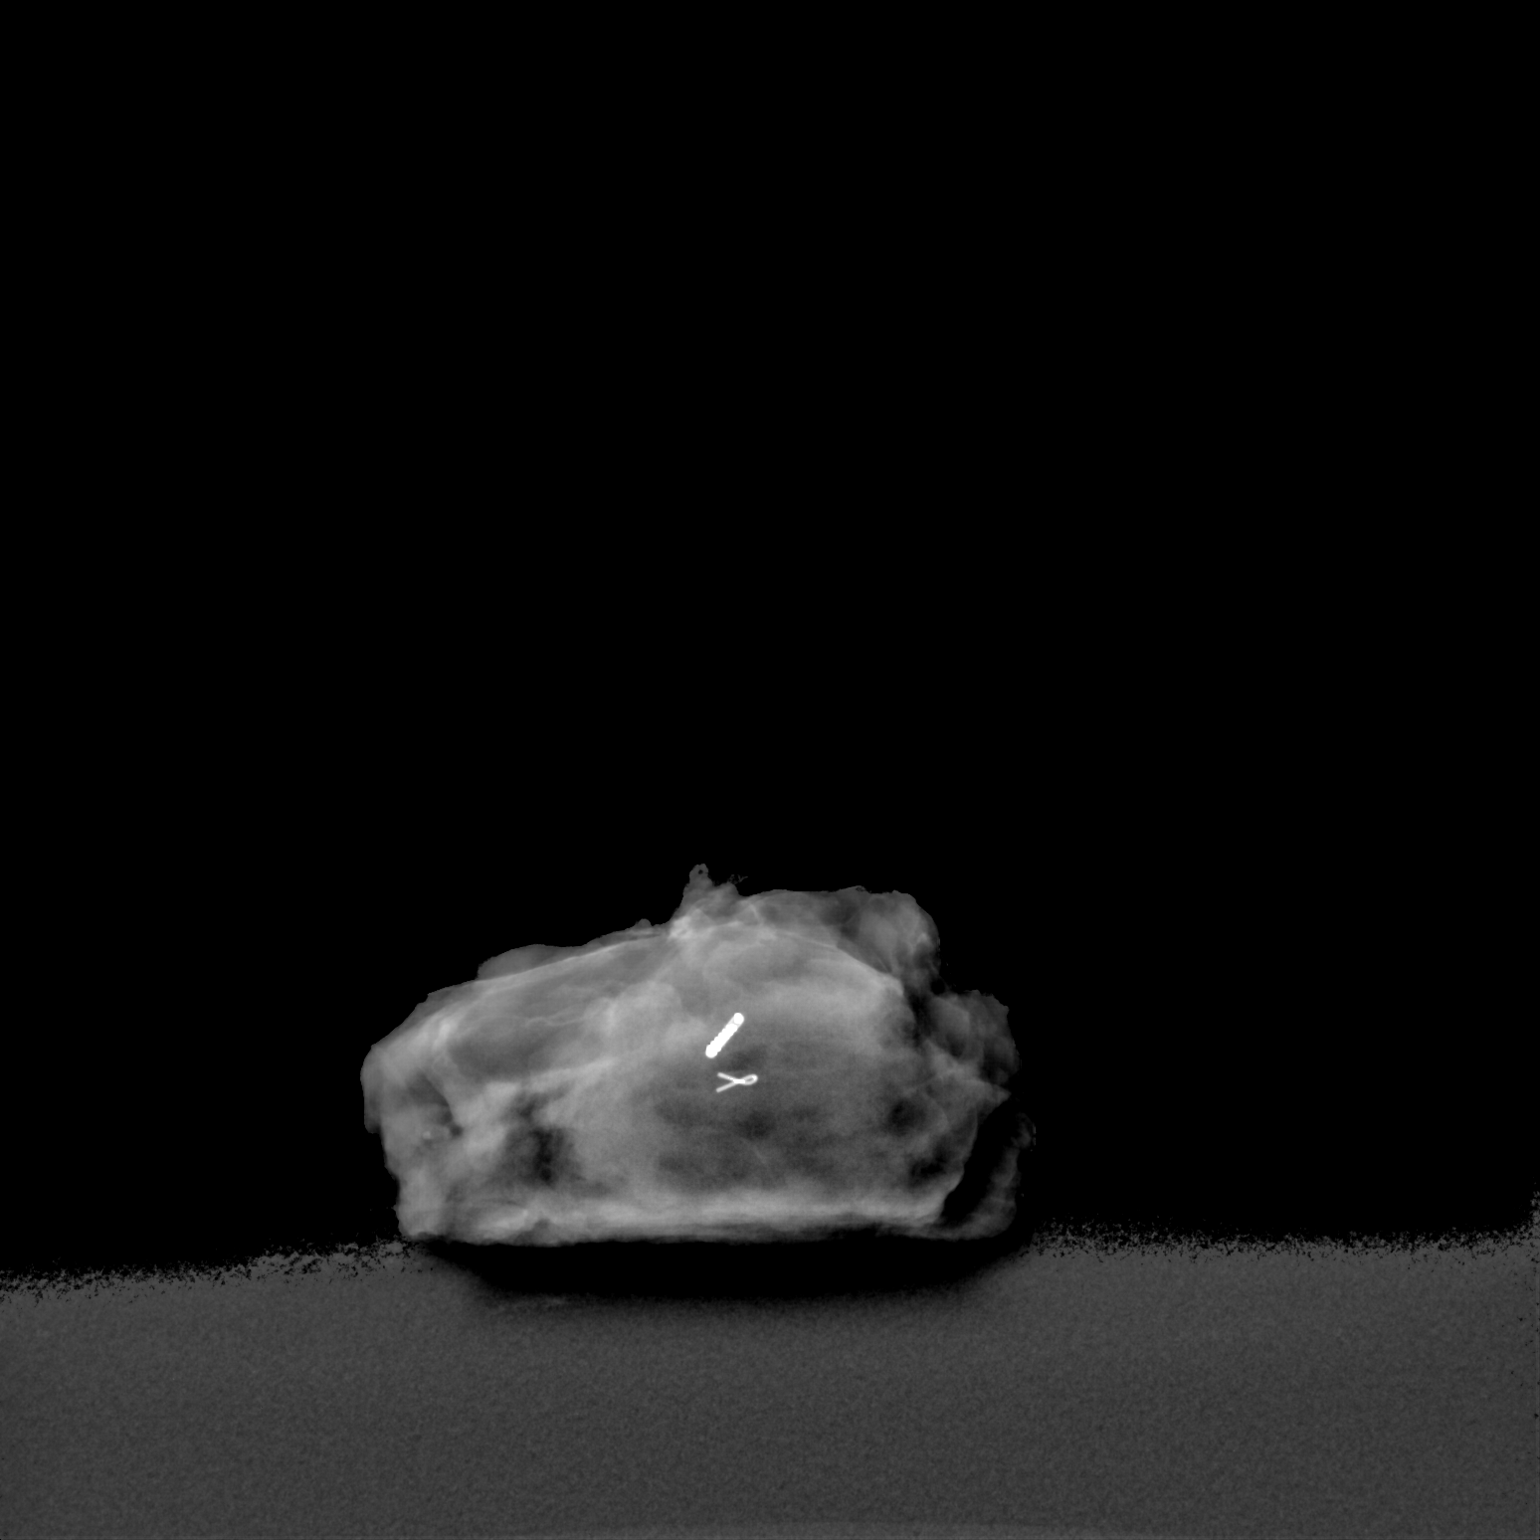
[im 2/2]
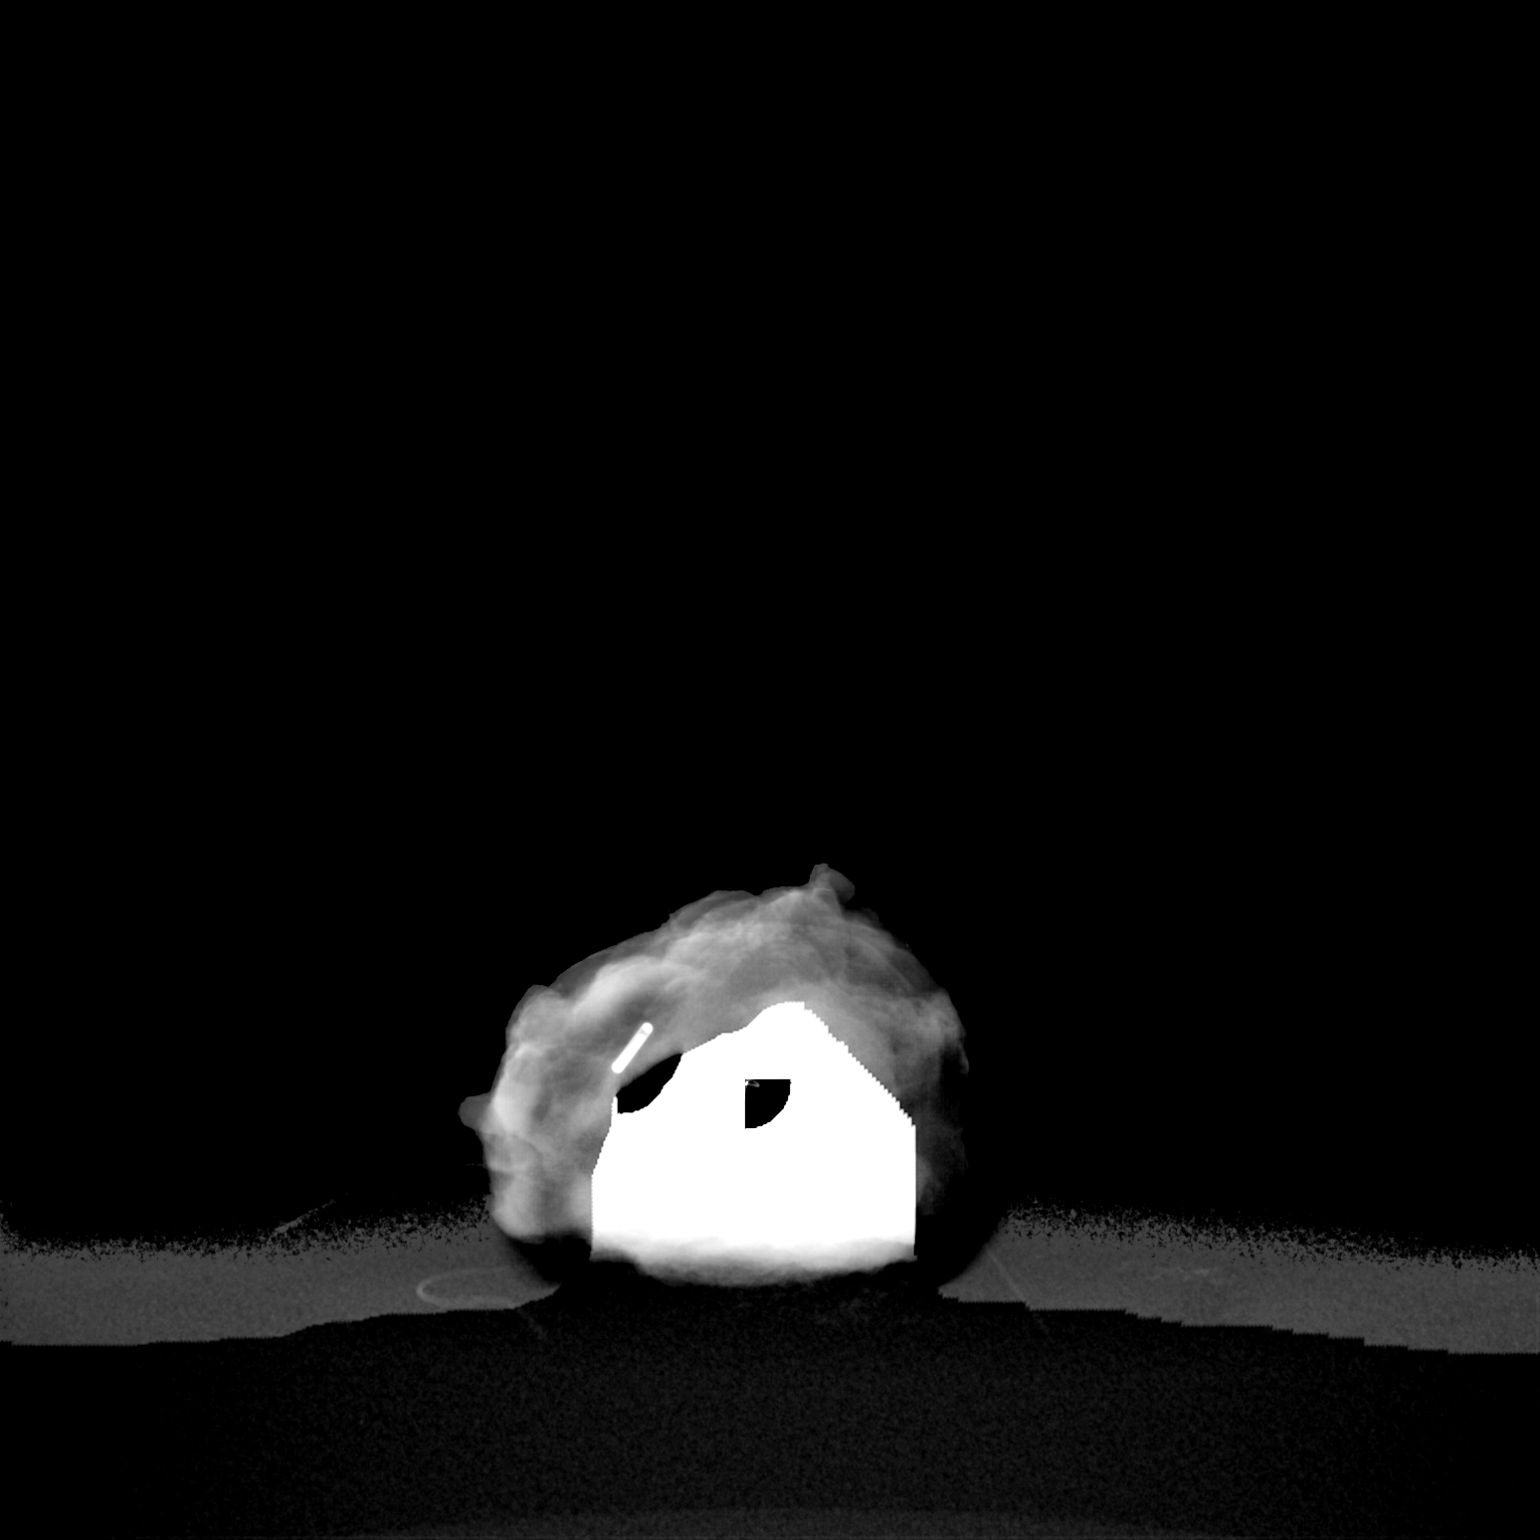

[2 of 2 positions shown; findings below may reference images not displayed]

FINDINGS: Status post excision of the left breast. The radioactive seed and
biopsy marker clip are present within the specimen.
IMPRESSION: Specimen radiograph of the left breast.

## 2020-04-08 ENCOUNTER — Ambulatory Visit: Payer: BC Managed Care – PPO | Attending: Internal Medicine

## 2020-04-08 DIAGNOSIS — Z23 Encounter for immunization: Secondary | ICD-10-CM

## 2020-04-08 NOTE — Progress Notes (Signed)
   Covid-19 Vaccination Clinic  Name:  Madeline Morris    MRN: [id number] DOB: 1960/10/27  04/08/2020  [name] was observed post Covid-19 immunization for 15 minutes without incident. She was provided with Vaccine Information Sheet and instruction to access the V-Safe system.   [name] was instructed to call 911 with any severe reactions post vaccine: Marland Kitchen Difficulty breathing  . Swelling of face and throat  . A fast heartbeat  . A bad rash all over body  . Dizziness and weakness

## 2020-05-18 ENCOUNTER — Other Ambulatory Visit: Payer: Self-pay | Admitting: Podiatry

## 2020-05-18 DIAGNOSIS — L814 Other melanin hyperpigmentation: Secondary | ICD-10-CM | POA: Diagnosis not present

## 2020-05-18 DIAGNOSIS — L570 Actinic keratosis: Secondary | ICD-10-CM | POA: Diagnosis not present

## 2020-05-18 DIAGNOSIS — L821 Other seborrheic keratosis: Secondary | ICD-10-CM | POA: Diagnosis not present

## 2020-05-18 DIAGNOSIS — L578 Other skin changes due to chronic exposure to nonionizing radiation: Secondary | ICD-10-CM | POA: Diagnosis not present

## 2020-05-18 DIAGNOSIS — D225 Melanocytic nevi of trunk: Secondary | ICD-10-CM | POA: Diagnosis not present

## 2020-05-18 DIAGNOSIS — D485 Neoplasm of uncertain behavior of skin: Secondary | ICD-10-CM | POA: Diagnosis not present

## 2020-05-18 DIAGNOSIS — C44722 Squamous cell carcinoma of skin of right lower limb, including hip: Secondary | ICD-10-CM | POA: Diagnosis not present

## 2020-05-19 NOTE — Telephone Encounter (Signed)
Please Advise

## 2020-05-26 DIAGNOSIS — F411 Generalized anxiety disorder: Secondary | ICD-10-CM | POA: Diagnosis not present

## 2020-05-26 DIAGNOSIS — F332 Major depressive disorder, recurrent severe without psychotic features: Secondary | ICD-10-CM | POA: Diagnosis not present

## 2020-06-21 ENCOUNTER — Other Ambulatory Visit: Payer: Self-pay | Admitting: Podiatry

## 2020-06-21 NOTE — Telephone Encounter (Signed)
Please advise 

## 2020-06-25 DIAGNOSIS — E7800 Pure hypercholesterolemia, unspecified: Secondary | ICD-10-CM | POA: Diagnosis not present

## 2020-06-25 DIAGNOSIS — F411 Generalized anxiety disorder: Secondary | ICD-10-CM | POA: Diagnosis not present

## 2020-06-25 DIAGNOSIS — F332 Major depressive disorder, recurrent severe without psychotic features: Secondary | ICD-10-CM | POA: Diagnosis not present

## 2020-07-22 DIAGNOSIS — C44722 Squamous cell carcinoma of skin of right lower limb, including hip: Secondary | ICD-10-CM | POA: Diagnosis not present

## 2020-09-20 ENCOUNTER — Other Ambulatory Visit: Payer: Self-pay | Admitting: Obstetrics and Gynecology

## 2020-09-20 DIAGNOSIS — Z1231 Encounter for screening mammogram for malignant neoplasm of breast: Secondary | ICD-10-CM

## 2020-10-12 DIAGNOSIS — Z6822 Body mass index (BMI) 22.0-22.9, adult: Secondary | ICD-10-CM | POA: Diagnosis not present

## 2020-10-12 DIAGNOSIS — Z01419 Encounter for gynecological examination (general) (routine) without abnormal findings: Secondary | ICD-10-CM | POA: Diagnosis not present

## 2020-11-11 ENCOUNTER — Ambulatory Visit
Admission: RE | Admit: 2020-11-11 | Discharge: 2020-11-11 | Disposition: A | Discharge status: Home / Self Care | Payer: BC Managed Care – PPO | Source: Ambulatory Visit | Attending: Obstetrics and Gynecology | Admitting: Obstetrics and Gynecology

## 2020-11-11 ENCOUNTER — Other Ambulatory Visit: Payer: Self-pay

## 2020-11-11 DIAGNOSIS — Z1231 Encounter for screening mammogram for malignant neoplasm of breast: Secondary | ICD-10-CM

## 2020-11-16 DIAGNOSIS — L570 Actinic keratosis: Secondary | ICD-10-CM | POA: Diagnosis not present

## 2020-11-16 DIAGNOSIS — L578 Other skin changes due to chronic exposure to nonionizing radiation: Secondary | ICD-10-CM | POA: Diagnosis not present

## 2020-11-16 DIAGNOSIS — D225 Melanocytic nevi of trunk: Secondary | ICD-10-CM | POA: Diagnosis not present

## 2020-11-16 DIAGNOSIS — L814 Other melanin hyperpigmentation: Secondary | ICD-10-CM | POA: Diagnosis not present

## 2020-11-16 DIAGNOSIS — L821 Other seborrheic keratosis: Secondary | ICD-10-CM | POA: Diagnosis not present

## 2020-12-30 IMAGING — MG DIGITAL SCREENING BILAT W/ TOMO W/ CAD
8 series · 9 of 24 positions shown · non-contrast
Comparison: Previous exam(s).

CLINICAL DATA: Screening. History of excision of atypical ductal
hyperplasia and flat epithelial atypia in the LEFT breast in Sunday November, 2018.

EXAM:
DIGITAL SCREENING BILATERAL MAMMOGRAM WITH TOMO AND CAD

[L CC synth-2D]
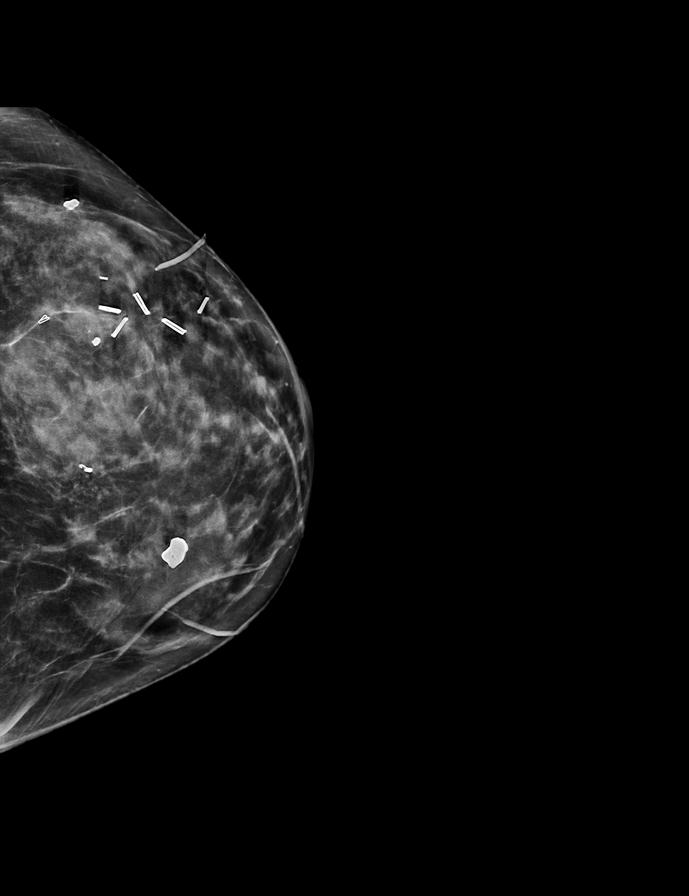

[R MLO synth-2D]
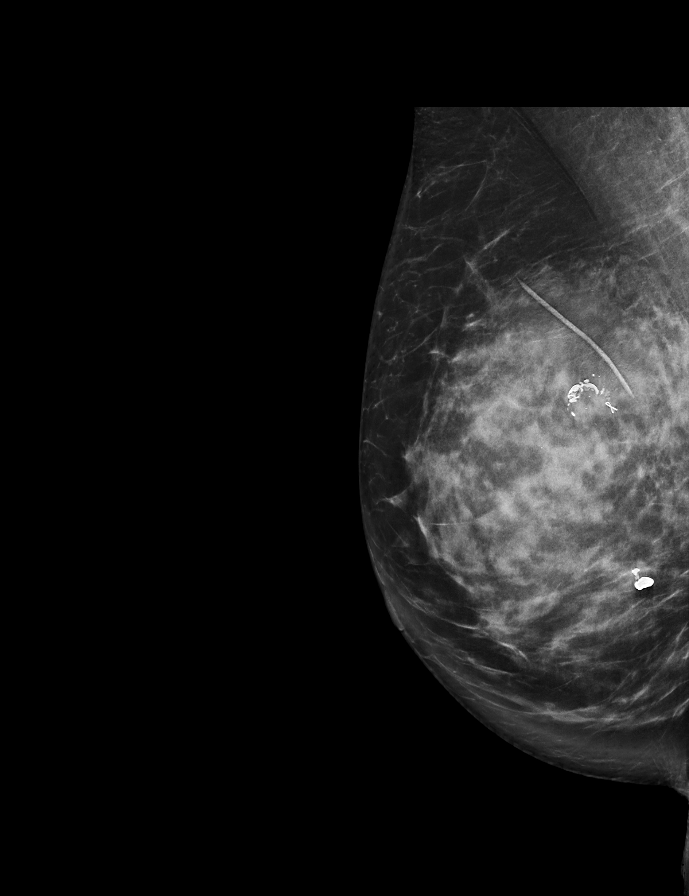

[R CC synth-2D]
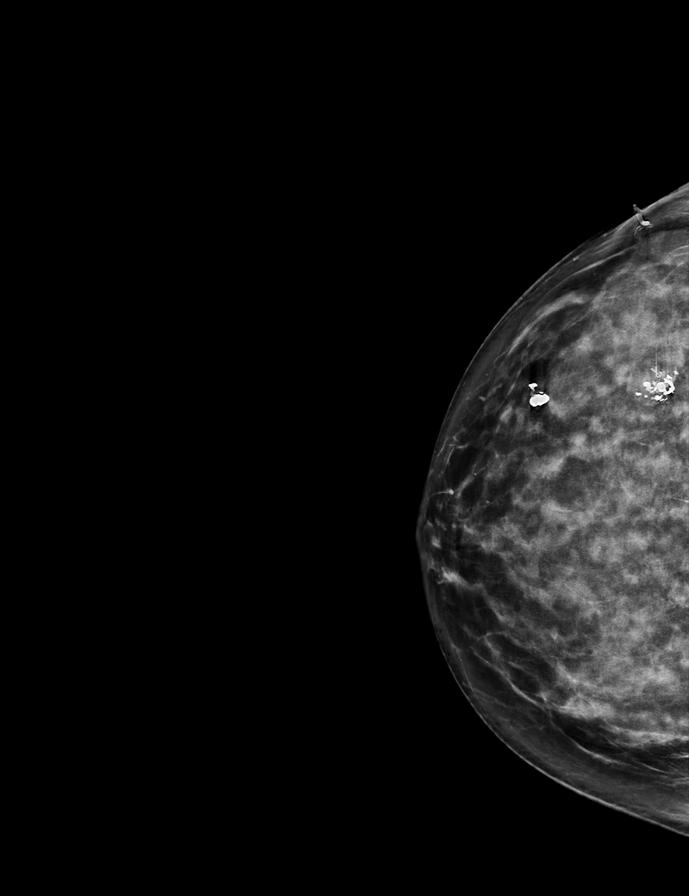

[L MLO synth-2D]
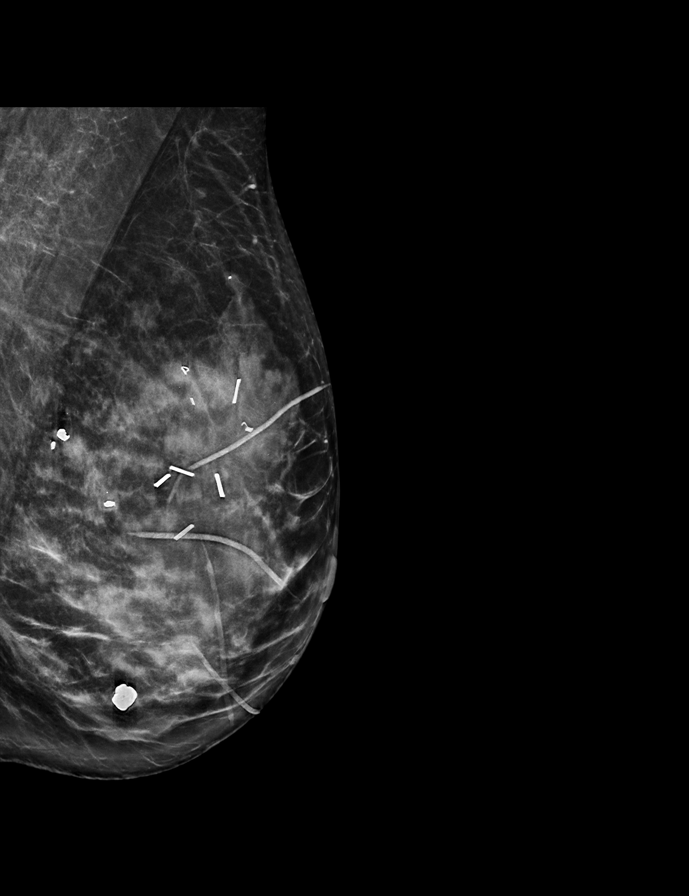

[L CC tomo · 2 of 64 frames shown]
[frame 21/64]
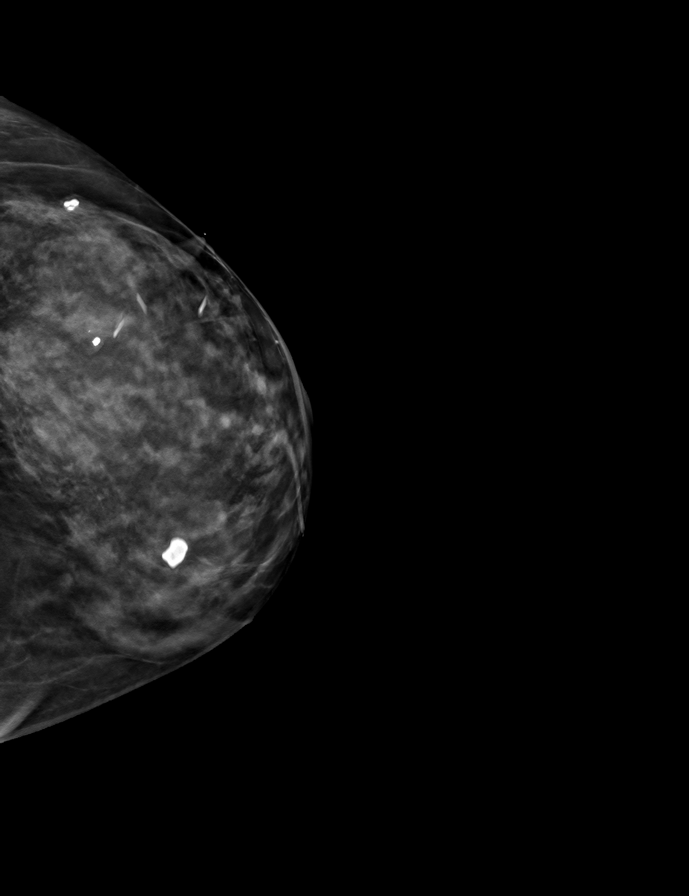
[frame 33/64]
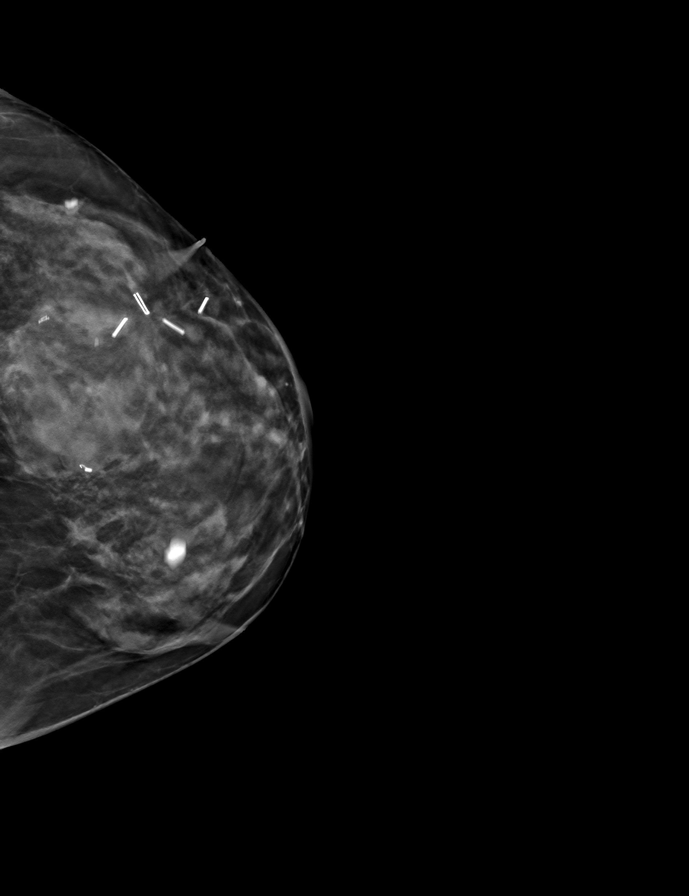

[R MLO tomo · tomo slice 38/75.0]
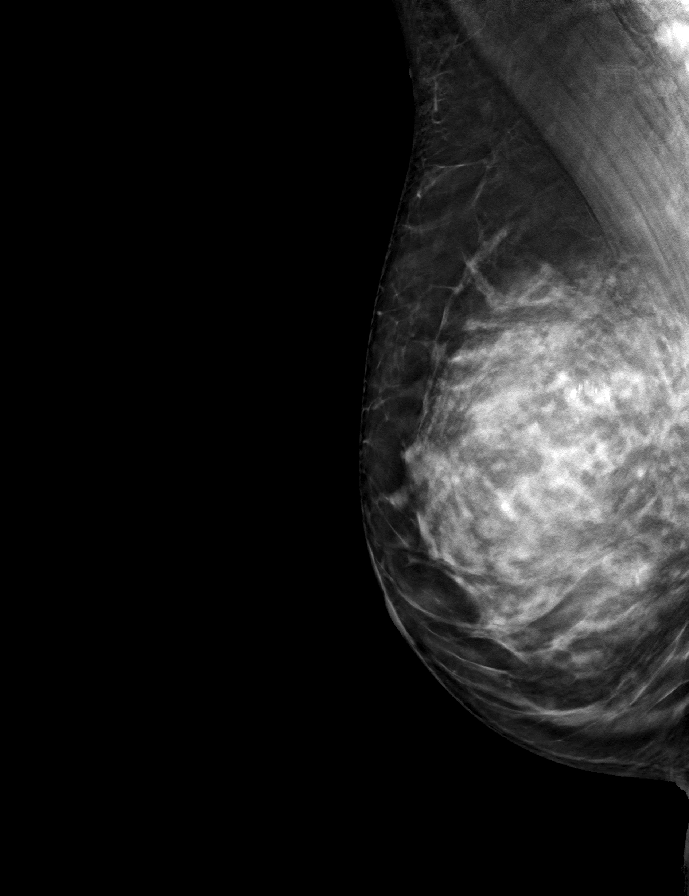

[L MLO tomo · tomo slice 31/62.0]
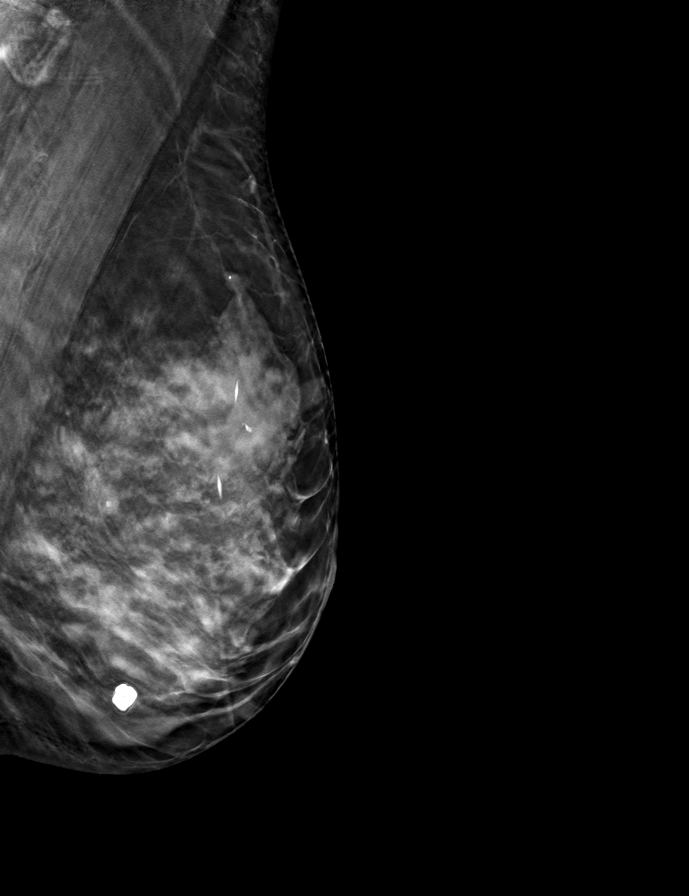

[R CC tomo · tomo slice 35/70.0]
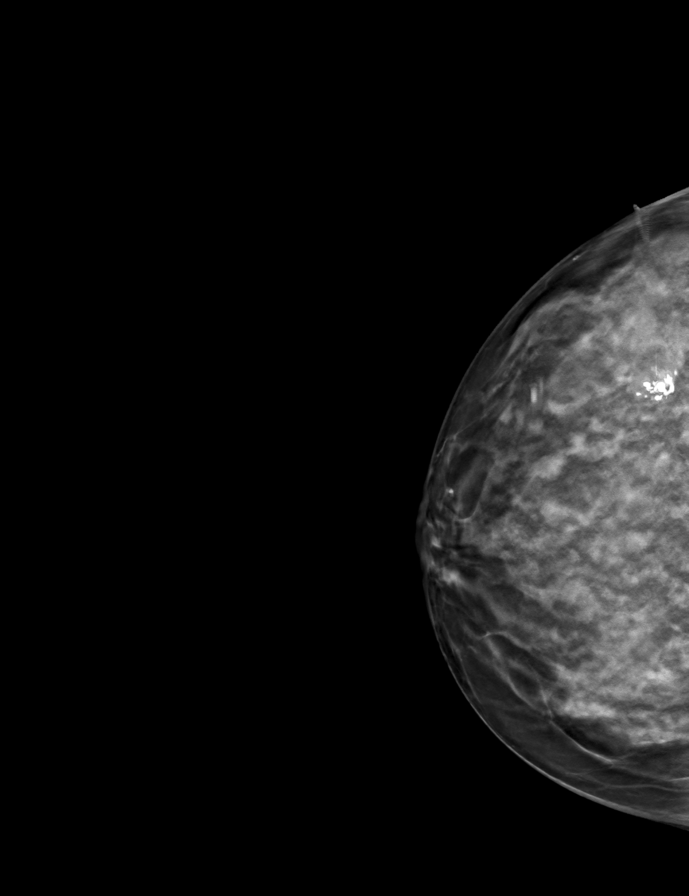

[9 of 24 positions shown; findings below may reference images not displayed]

ACR Breast Density Category c: The breast tissue is heterogeneously
dense, which may obscure small masses.
FINDINGS: Post operative changes are seen in the LEFTbreast. There are no
findings suspicious for malignancy. Images were processed with CAD.
IMPRESSION: No mammographic evidence of malignancy. A result letter of this
screening mammogram will be mailed directly to the patient.

RECOMMENDATION:
Screening mammogram in one year. (Code:[id number])

BI-RADS CATEGORY  1: Negative.

## 2021-01-24 DIAGNOSIS — F332 Major depressive disorder, recurrent severe without psychotic features: Secondary | ICD-10-CM | POA: Diagnosis not present

## 2021-01-24 DIAGNOSIS — E7800 Pure hypercholesterolemia, unspecified: Secondary | ICD-10-CM | POA: Diagnosis not present

## 2021-01-24 DIAGNOSIS — T753XXA Motion sickness, initial encounter: Secondary | ICD-10-CM | POA: Diagnosis not present

## 2021-01-24 DIAGNOSIS — I10 Essential (primary) hypertension: Secondary | ICD-10-CM | POA: Diagnosis not present

## 2021-04-29 ENCOUNTER — Other Ambulatory Visit: Payer: Self-pay | Admitting: Podiatry

## 2021-04-29 NOTE — Telephone Encounter (Signed)
Please advise 

## 2021-05-02 NOTE — Telephone Encounter (Signed)
Returned the call to patient, giving information for approval on medicine and that it has been sent to pharmacy on file.

## 2021-05-02 NOTE — Telephone Encounter (Signed)
Refill sent to the pharmacy 

## 2021-05-23 DIAGNOSIS — L821 Other seborrheic keratosis: Secondary | ICD-10-CM | POA: Diagnosis not present

## 2021-05-23 DIAGNOSIS — L814 Other melanin hyperpigmentation: Secondary | ICD-10-CM | POA: Diagnosis not present

## 2021-05-23 DIAGNOSIS — D225 Melanocytic nevi of trunk: Secondary | ICD-10-CM | POA: Diagnosis not present

## 2021-05-23 DIAGNOSIS — L578 Other skin changes due to chronic exposure to nonionizing radiation: Secondary | ICD-10-CM | POA: Diagnosis not present

## 2021-07-11 ENCOUNTER — Other Ambulatory Visit: Payer: Self-pay | Admitting: Podiatry

## 2021-07-11 NOTE — Telephone Encounter (Signed)
Please advise 

## 2021-09-05 ENCOUNTER — Ambulatory Visit (HOSPITAL_BASED_OUTPATIENT_CLINIC_OR_DEPARTMENT_OTHER): Payer: BC Managed Care – PPO | Admitting: Family

## 2021-09-05 ENCOUNTER — Encounter (HOSPITAL_BASED_OUTPATIENT_CLINIC_OR_DEPARTMENT_OTHER): Payer: Self-pay | Admitting: Family

## 2021-09-05 ENCOUNTER — Ambulatory Visit (INDEPENDENT_AMBULATORY_CARE_PROVIDER_SITE_OTHER): Payer: BC Managed Care – PPO

## 2021-09-05 ENCOUNTER — Other Ambulatory Visit: Payer: Self-pay

## 2021-09-05 VITALS — BP 126/76 | HR 72 | Ht 67.0 in | Wt 147.0 lb

## 2021-09-05 DIAGNOSIS — R002 Palpitations: Secondary | ICD-10-CM | POA: Diagnosis not present

## 2021-09-05 DIAGNOSIS — E782 Mixed hyperlipidemia: Secondary | ICD-10-CM

## 2021-09-05 DIAGNOSIS — I10 Essential (primary) hypertension: Secondary | ICD-10-CM | POA: Diagnosis not present

## 2021-09-05 NOTE — Patient Instructions (Addendum)
Medication Instructions:  ?Continue your current medications.  ? ?*If you need a refill on your cardiac medications before your next appointment, please call your pharmacy* ? ? ?Lab Work: ?Your physician recommends that you return for lab work today: BMP, TSH, CBC, magnesium ? ?If you have labs (blood work) drawn today and your tests are completely normal, you will receive your results only by: ?MyChart Message (if you have MyChart) OR ?A paper copy in the mail ?If you have any lab test that is abnormal or we need to change your treatment, we will call you to review the results. ? ? ?Testing/Procedures: ? ?Your EKG today showed normal sinus rhythm with an occasional early beat called a PVC (premature ventricular contraction) - these are not dangerous but can cause sensation of palpitations. We will plan for a monitor for further evaluation.  ? ?Your physician has recommended that you wear a Zio monitor.  ? ?This monitor is a medical device that records the heart?s electrical activity. Doctors most often use these monitors to diagnose arrhythmias. Arrhythmias are problems with the speed or rhythm of the heartbeat. The monitor is a small device applied to your chest. You can wear one while you do your normal daily activities. While wearing this monitor if you have any symptoms to push the button and record what you felt. Once you have worn this monitor for the period of time provider prescribed (Usually 14 days), you will return the monitor device in the postage paid box. Once it is returned they will download the data collected and provide [place] with a report which the provider will then review and we will call you with those results. Important tips: ? ?Avoid showering during the first 24 hours of wearing the monitor. ?Avoid excessive sweating to help maximize wear time. ?Do not submerge the device, no hot tubs, and no swimming pools. ?Keep any lotions or oils away from the patch. ?After 24 hours you may shower with the  patch on. Take brief showers with your back facing the shower head.  ?Do not remove patch once it has been placed because that will interrupt data and decrease adhesive wear time. ?Push the button when you have any symptoms and write down what you were feeling. ?Once you have completed wearing your monitor, remove and place into box which has postage paid and place in your outgoing mailbox.  ?If for some reason you have misplaced your box then call our office and we can provide another box and/or mail it off for you. ? ? ?Follow-Up: ?At [facility], you and your health needs are our priority.  As part of our continuing mission to provide you with exceptional heart care, we have created designated Provider Care Teams.  These Care Teams include your primary Cardiologist (physician) and Advanced Practice Providers (APPs -  Physician Assistants and Nurse Practitioners) who all work together to provide you with the care you need, when you need it. ? ?We recommend signing up for the patient portal called "MyChart".  Sign up information is provided on this After Visit Summary.  MyChart is used to connect with patients for Virtual Visits (Telemedicine).  Patients are able to view lab/test results, encounter notes, upcoming appointments, etc.  Non-urgent messages can be sent to your provider as well.   ?To learn more about what you can do with MyChart, go to [email or web address].   ? ?Your next appointment:   ?6-8 week(s) ? ?The format for your next appointment:   ?In Person ? ?  Provider:   ?Chilton Si, MD or Alver Sorrow, NP   ? ? ?Other Instructions ? ?To prevent palpitations: ?Make sure you are adequately hydrated.  ?Avoid and/or limit caffeine containing beverages like soda or tea. ?Exercise regularly.  ?Manage stress well. ?Some over the counter medications can cause palpitations such as Benadryl, AdvilPM, TylenolPM. Regular Advil or Tylenol do not cause palpitations.   ?

## 2021-09-05 NOTE — Progress Notes (Signed)
? ?Office Visit  ?  ?Patient Name: Madeline Morris ?Date of Encounter: 09/05/2021 ? ?PCP:  Renford Dills, MD ?  ?[facility]  ?Cardiologist:  Chilton Si, MD  ?Advanced Practice Provider:  No care team member to display ?Electrophysiologist:  None  ?   ? ?Chief Complaint  ?  ?Madeline Morris is a 61 y.o. female with a hx of hypertension, hyperlipidemia presents today for follow-up hypertension ? ?Past Medical History  ?  ?Past Medical History:  ?Diagnosis Date  ? Atrophy of vagina   ? 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 Bacterial infection   ? Chlamydia   ? Dense breast   ? Dyspareunia in female   ? Endometriosis   ? Herpes   ? Herpes simplex   ? High blood pressure   ? History of chicken pox   ? History of measles   ? History of mumps   ? Hypertensive disorder   ? PONV (postoperative nausea and vomiting)   ? Yeast infection   ? ?Past Surgical History:  ?Procedure Laterality Date  ? ABDOMINAL HYSTERECTOMY    ? BREAST BIOPSY Left   ? x3  ? BREAST CYST ASPIRATION    ? BREAST EXCISIONAL BIOPSY Left 11/2018  ? BREAST EXCISIONAL BIOPSY Left   ? many years ago  ? BREAST EXCISIONAL BIOPSY Right   ? BREAST LUMPECTOMY WITH RADIOACTIVE SEED LOCALIZATION Left 11/22/2018  ? Procedure: LEFT BREAST LUMPECTOMY WITH RADIOACTIVE SEED LOCALIZATION;  Surgeon: Claud Kelp, MD;  Location: [facility];  Service: General;  Laterality: Left;  ? LAPAROSCOPY    ? MYOMECTOMY    ? TONSILECTOMY, ADENOIDECTOMY, BILATERAL MYRINGOTOMY AND TUBES    ? VESICOVAGINAL FISTULA CLOSURE W/ TAH    ? WISDOM TOOTH EXTRACTION    ? ? ?Allergies ? ?No Known Allergies ? ?History of Present Illness  ?  ?Madeline Morris is a 61 y.o. female with a hx of hypertension, hyperlipidemia last seen 08/2019 via video visit. ? ?She was initially seen by Dr. Duke Salvia July 2018 for hypertension and risk assessment prior to starting HRT.  Prior carotid Dopplers 4/15 with mild bilateral intimal thickening with no  plaque.  Her losartan was increased at that time.  She was referred for exercise tolerance test due to exertional dyspnea with baseline EKG changes though no horizontal ST depression and was able to obtain 11.7 METS on Bruce protocol.  She was referred for coronary CTA with coronary calcium score of 0.   ? ?She was last seen via virtual visit 08/2019 doing overall well.  Her blood pressure was well controlled.  She had stopped hormone therapy due to breast cancer scare. ? ?She presents today for follow-up. She has retired since last seen. She and her [place] a [place] home in [place], [place]. She does not check her BP routinely at home. Tells me from Thursday to yesterday she started having palpitations with sensation of her heart racing. Tells me her palpitations felt continuous for four days but resolved yesterday.  No recent stressors, drinks 3 caffeinated beverages daily, no proarrhythmic agents noted.  She has not been exercising as much recently. Reports no shortness of breath nor dyspnea on exertion. Reports no chest pain, pressure, or tightness. No edema, orthopnea, PND.  ? ?EKGs/Labs/Other Studies Reviewed:  ? ?The following studies were reviewed today: ?ETT 01/04/2017 ?Blood pressure demonstrated a hypertensive response to exercise. ?Horizontal ST segment depression ST segment depression of 2 mm was  noted during stress in the II, III, V5, V6 and aVF leads. ?T wave inversion was noted during stress. ?  ?Baseline ECG shows LVH and prominent ST depression, much more obvious compared to previous ECG on file. ?There is no additional ST segment depression during exercise. Good exercise capacity. ?Study specificity is limited by the presence of baseline ST segment abnormalities. Consider imaging studies if there is high suspicion for CAD. ?  ?Coronary CTA 01/26/2017 ?IMPRESSION: ?1) Calcium score 0 ?  ?2) Normal right dominant coronary arteries ?  ?3) Normal aortic root 3.5 cm ? ?EKG:  EKG is ordered today.  The  ekg ordered today demonstrates NSR 72 bpm with PAC, rightward axis, nonspecific T wave inversion in lead III which is stable compared to prior. ? ?Recent Labs: ?No results found for requested labs within last 8760 hours.  ?Recent Lipid Panel ?No results found for: CHOL, TRIG, HDL, CHOLHDL, VLDL, LDLCALC, LDLDIRECT ? ? ?Home Medications  ? ?Current Meds  ?Medication Sig  ? losartan (COZAAR) 100 MG tablet Take 1 tablet (100 mg total) by mouth daily.  ? meloxicam (MOBIC) 15 MG tablet Take 1 tablet (15 mg total) by mouth daily.  ? rosuvastatin (CRESTOR) 5 MG tablet Take 5 mg by mouth daily.  ? triamterene-hydrochlorothiazide (MAXZIDE-25) 37.5-25 MG tablet Take 1 tablet by mouth daily.   ?  ? ?Review of Systems  ?    ?All other systems reviewed and are otherwise negative except as noted above. ? ?Physical Exam  ?  ?VS:  BP 126/76   Pulse 72   Ht 5\' 7"  (1.702 m)   Wt 147 lb (66.7 kg)   SpO2 99%   BMI 23.02 kg/m?  , BMI Body mass index is 23.02 kg/m?. ? ?Wt Readings from Last 3 Encounters:  ?09/05/21 147 lb (66.7 kg)  ?08/26/19 135 lb (61.2 kg)  ?08/07/19 132 lb (59.9 kg)  ?  ?GEN: Well nourished, well developed, in no acute distress. ?HEENT: normal. ?Neck: Supple, no JVD, carotid bruits, or masses. ?Cardiac: RRR, no murmurs, rubs, or gallops. No clubbing, cyanosis, edema.  Radials/PT 2+ and equal bilaterally.  ?Respiratory:  Respirations regular and unlabored, clear to auscultation bilaterally. ?GI: Soft, nontender, nondistended. ?MS: No deformity or atrophy. ?Skin: Warm and dry, no rash. ?Neuro:  Strength and sensation are intact. ?Psych: Normal affect. ? ?Assessment & Plan  ?  ?Palpitations -recent 4-day history of palpitations with sensation of heart racing.  EKG today sinus rhythm with occasional PAC.   3 caffeinated beverages per day, no significant alcohol intake nor stress.  No proarrhythmic agents.  Encouraged to monitor symptoms to assess for causative factor.  14-day ZIO placed in clinic.  Updates [place],  magnesium, TSH, CBC to rule light lab work abnormalities contributory.  If ZIO monitor with significant arrhythmia consider echo and/or BB therapy ? ?HTN - BP well controlled. Continue current antihypertensive regimen.   ? ?HLD - Continue Crestor 5mg  QD. ? ?Disposition: Follow up in 6 week(s) with 08/09/19, MD or APP. ? ?Signed, ? , NP ?09/05/2021, 11:12 AM ?[facility] ?

## 2021-09-06 LAB — MAGNESIUM: Magnesium: 2.3 mg/dL (ref 1.6–2.3)

## 2021-09-06 LAB — CBC
Hematocrit: 43.1 % (ref 34.0–46.6)
Hemoglobin: 14.5 g/dL (ref 11.1–15.9)
MCH: 30.3 pg (ref 26.6–33.0)
MCHC: 33.6 g/dL (ref 31.5–35.7)
MCV: 90 fL (ref 79–97)
Platelets: 313 10*3/uL (ref 150–450)
RBC: 4.78 x10E6/uL (ref 3.77–5.28)
RDW: 12.2 % (ref 11.7–15.4)
WBC: 6.1 10*3/uL (ref 3.4–10.8)

## 2021-09-06 LAB — BASIC METABOLIC PANEL
BUN/Creatinine Ratio: 23 (ref 12–28)
BUN: 17 mg/dL (ref 8–27)
CO2: 23 mmol/L (ref 20–29)
Calcium: 10.4 mg/dL — ABNORMAL HIGH (ref 8.7–10.3)
Chloride: 96 mmol/L (ref 96–106)
Creatinine, Ser: 0.73 mg/dL (ref 0.57–1.00)
Glucose: 86 mg/dL (ref 70–99)
Potassium: 4.1 mmol/L (ref 3.5–5.2)
Sodium: 135 mmol/L (ref 134–144)
eGFR: 94 mL/min/{1.73_m2} (ref >59)

## 2021-09-06 LAB — TSH: TSH: 2.22 u[IU]/mL (ref 0.450–4.500)

## 2021-09-17 DIAGNOSIS — S6992XA Unspecified injury of left wrist, hand and finger(s), initial encounter: Secondary | ICD-10-CM | POA: Diagnosis not present

## 2021-09-17 DIAGNOSIS — R791 Abnormal coagulation profile: Secondary | ICD-10-CM | POA: Diagnosis not present

## 2021-09-17 DIAGNOSIS — S52612A Displaced fracture of left ulna styloid process, initial encounter for closed fracture: Secondary | ICD-10-CM | POA: Diagnosis not present

## 2021-09-17 DIAGNOSIS — W1830XA Fall on same level, unspecified, initial encounter: Secondary | ICD-10-CM | POA: Diagnosis not present

## 2021-09-17 DIAGNOSIS — R7989 Other specified abnormal findings of blood chemistry: Secondary | ICD-10-CM | POA: Diagnosis not present

## 2021-09-17 DIAGNOSIS — M7989 Other specified soft tissue disorders: Secondary | ICD-10-CM | POA: Diagnosis not present

## 2021-09-17 DIAGNOSIS — S52572A Other intraarticular fracture of lower end of left radius, initial encounter for closed fracture: Secondary | ICD-10-CM | POA: Diagnosis not present

## 2021-09-20 ENCOUNTER — Encounter (HOSPITAL_BASED_OUTPATIENT_CLINIC_OR_DEPARTMENT_OTHER): Payer: Self-pay

## 2021-09-20 DIAGNOSIS — S52502K Unspecified fracture of the lower end of left radius, subsequent encounter for closed fracture with nonunion: Secondary | ICD-10-CM | POA: Diagnosis not present

## 2021-09-21 DIAGNOSIS — S52502A Unspecified fracture of the lower end of left radius, initial encounter for closed fracture: Secondary | ICD-10-CM | POA: Diagnosis not present

## 2021-09-23 ENCOUNTER — Encounter (HOSPITAL_BASED_OUTPATIENT_CLINIC_OR_DEPARTMENT_OTHER): Payer: Self-pay

## 2021-09-23 ENCOUNTER — Telehealth (HOSPITAL_BASED_OUTPATIENT_CLINIC_OR_DEPARTMENT_OTHER): Payer: Self-pay

## 2021-09-23 ENCOUNTER — Telehealth: Payer: Self-pay | Admitting: Cardiovascular Disease

## 2021-09-23 NOTE — Telephone Encounter (Signed)
Madeline Morris ?You saw this patient on 09/05/21 for HTN. She now needs wrist surgery for "broken wrist." Since she has no history of CAD and reassuring CT coronary, do you feel comfortable providing preop risk assessment? ?

## 2021-09-23 NOTE — Telephone Encounter (Addendum)
? ?  Pre-operative Risk Assessment  ?  ?Patient Name: Madeline Morris  ?DOB: 04-23-1961 ?MRN: [id number]  ? ?  ? ?Request for Surgical Clearance   ? ?Procedure:   Left Distal Radius ORIF with repair  ? ?Date of Surgery:  Clearance 09/26/21                              ?   ?Surgeon:  Dr. Bradly Bienenstock  ?Surgeon's Group or Practice Name:  [facility]  ?Phone number:  [phone] ?Fax number: Fransisca Connors- [phone] ?  ?Type of Clearance Requested:   ?- Medical  ?- Pharmacy:  Hold Does patient need to hold any medications?     ?  ?Type of Anesthesia:   IV Sedation/ block ?  ?Additional requests/questions:   Please Fax  a stat clearance to the above listed numbers ? ?Signed, ?Marlene Lard   ?09/23/2021, 10:25 AM  ? ?

## 2021-09-23 NOTE — Telephone Encounter (Signed)
Duplicate encounter. See separate phone note 09/23/21. Clearance provided. ? ?Alver Sorrow, NP  ?

## 2021-09-23 NOTE — Telephone Encounter (Signed)
? ? ?  Patient Name: Madeline Morris  ?DOB: 1960-07-18 ?MRN: [id number] ? ?Primary Cardiologist: Chilton Si, MD ? ?Chart reviewed as part of pre-operative protocol coverage. Given past medical history and time since last visit, based on ACC/AHA guidelines, [name] would be at acceptable risk for the planned procedure without further cardiovascular testing. She does not take antiplatelet or anticoagulant that would need to be stopped prior to procedure.  ? ?Follows with our clinic for hypertension which was well controlled at recent clinic visit 09/05/21. Prior cardiac CTA 2018 with coronary calcium score of 0.  ? ?The patient was advised that if she develops new symptoms prior to surgery to contact our office to arrange for a follow-up visit, and she verbalized understanding. ? ?I will route this recommendation to the requesting party via Epic fax function and remove from pre-op pool. ? ?Please call with questions. ? ?Alver Sorrow, NP ?09/23/2021, 12:36 PM  ?

## 2021-09-23 NOTE — Telephone Encounter (Signed)
? ?  Pre-operative Risk Assessment  ?  ?Patient Name: Madeline Morris  ?DOB: 1960-09-12 ?MRN: [id number]  ? ?  ? ?Request for Surgical Clearance   ? ?Procedure:   Broken Left Wrist ? ?Date of Surgery:  Clearance 09/26/21                              ?   ?Surgeon:  Dr. Caralyn Guile ?Surgeon's Group or Practice Name:  [facility] ?Phone number:  [phone] Ext. [phone] ?Fax number:  [phone] ?  ?Type of Clearance Requested:   ?- Medical  ?  ?Type of Anesthesia:   Nerve Block ?  ?Additional requests/questions:  Please fax a copy of medical clearance to the surgeon's office. ? ?Signed, ?Durel Salts   ?09/23/2021, 9:29 AM   ?

## 2021-09-26 DIAGNOSIS — X58XXXA Exposure to other specified factors, initial encounter: Secondary | ICD-10-CM | POA: Diagnosis not present

## 2021-09-26 DIAGNOSIS — Y999 Unspecified external cause status: Secondary | ICD-10-CM | POA: Diagnosis not present

## 2021-09-26 DIAGNOSIS — S52572A Other intraarticular fracture of lower end of left radius, initial encounter for closed fracture: Secondary | ICD-10-CM | POA: Diagnosis not present

## 2021-09-27 ENCOUNTER — Telehealth (HOSPITAL_BASED_OUTPATIENT_CLINIC_OR_DEPARTMENT_OTHER): Payer: Self-pay

## 2021-09-27 MED ORDER — METOPROLOL SUCCINATE ER 25 MG PO TB24
25.0000 mg | ORAL_TABLET | Freq: Every day | ORAL | 3 refills | Status: DC
Start: 2021-09-27 — End: 2021-11-02

## 2021-09-27 NOTE — Telephone Encounter (Addendum)
Seen by patient Madeline Morris on 09/27/2021  8:51 AM ? ?Labs ordered and mailed, prescriptions updated and sent to pharmacy on file  ? ? ? ? ?----- Message from Loel Dubonnet, NP sent at 09/27/2021  8:49 AM EDT ----- ?Monitor revealed predominantly sinus rhythm.  There were early beats on the top chamber of the heart called PACs occurring 6.2% of the time. 10 episodes of a fast heart beat which are overall very short. Recommend addition of Toprol 25mg  QD.  ?

## 2021-10-10 DIAGNOSIS — Z01419 Encounter for gynecological examination (general) (routine) without abnormal findings: Secondary | ICD-10-CM | POA: Diagnosis not present

## 2021-10-10 DIAGNOSIS — Z6822 Body mass index (BMI) 22.0-22.9, adult: Secondary | ICD-10-CM | POA: Diagnosis not present

## 2021-10-10 DIAGNOSIS — Z1211 Encounter for screening for malignant neoplasm of colon: Secondary | ICD-10-CM | POA: Diagnosis not present

## 2021-10-10 DIAGNOSIS — Z1239 Encounter for other screening for malignant neoplasm of breast: Secondary | ICD-10-CM | POA: Diagnosis not present

## 2021-10-11 DIAGNOSIS — S52532D Colles' fracture of left radius, subsequent encounter for closed fracture with routine healing: Secondary | ICD-10-CM | POA: Diagnosis not present

## 2021-10-25 DIAGNOSIS — M25632 Stiffness of left wrist, not elsewhere classified: Secondary | ICD-10-CM | POA: Diagnosis not present

## 2021-10-25 DIAGNOSIS — Z4789 Encounter for other orthopedic aftercare: Secondary | ICD-10-CM | POA: Diagnosis not present

## 2021-10-25 DIAGNOSIS — S52532D Colles' fracture of left radius, subsequent encounter for closed fracture with routine healing: Secondary | ICD-10-CM | POA: Diagnosis not present

## 2021-11-02 ENCOUNTER — Encounter (HOSPITAL_BASED_OUTPATIENT_CLINIC_OR_DEPARTMENT_OTHER): Payer: Self-pay | Admitting: Family

## 2021-11-02 ENCOUNTER — Ambulatory Visit (INDEPENDENT_AMBULATORY_CARE_PROVIDER_SITE_OTHER): Payer: BC Managed Care – PPO | Admitting: Family

## 2021-11-02 VITALS — BP 122/70 | HR 73 | Ht 67.0 in | Wt 145.1 lb

## 2021-11-02 DIAGNOSIS — I10 Essential (primary) hypertension: Secondary | ICD-10-CM | POA: Diagnosis not present

## 2021-11-02 DIAGNOSIS — E782 Mixed hyperlipidemia: Secondary | ICD-10-CM | POA: Diagnosis not present

## 2021-11-02 DIAGNOSIS — R002 Palpitations: Secondary | ICD-10-CM | POA: Diagnosis not present

## 2021-11-02 DIAGNOSIS — I491 Atrial premature depolarization: Secondary | ICD-10-CM | POA: Diagnosis not present

## 2021-11-02 DIAGNOSIS — M25632 Stiffness of left wrist, not elsewhere classified: Secondary | ICD-10-CM | POA: Diagnosis not present

## 2021-11-02 MED ORDER — METOPROLOL SUCCINATE ER 25 MG PO TB24: 25.0000 mg | ORAL_TABLET | ORAL | 2 refills | Status: AC | PRN

## 2021-11-02 NOTE — Patient Instructions (Signed)
Medication Instructions:   Your physician has recommended you make the following change in your medication:   CHANGE Metoprolol to only as needed for palpitations   *If you need a refill on your cardiac medications before your next appointment, please call your pharmacy*   Lab Work: None ordered today.   Testing/Procedures: None ordered today.   Your monitor showed PAC (premature atrial contractions) which are an early beat in the top chambers of your heart occurring 6.2% of the time. These are very common and not dangerous. We treat these early beats with Metoprolol if they are bothering you.  Follow-Up: At [facility], you and your health needs are our priority.  As part of our continuing mission to provide you with exceptional heart care, we have created designated Provider Care Teams.  These Care Teams include your primary Cardiologist (physician) and Advanced Practice Providers (APPs -  Physician Assistants and Nurse Practitioners) who all work together to provide you with the care you need, when you need it.  We recommend signing up for the patient portal called "MyChart".  Sign up information is provided on this After Visit Summary.  MyChart is used to connect with patients for Virtual Visits (Telemedicine).  Patients are able to view lab/test results, encounter notes, upcoming appointments, etc.  Non-urgent messages can be sent to your provider as well.   To learn more about what you can do with MyChart, go to [email or web address].    Your next appointment:   1 year(s)  The format for your next appointment:   In Person  Provider:   Chilton Si, MD or Alver Sorrow, NP     Other Instructions  Heart Healthy Diet Recommendations: A low-salt diet is recommended. Meats should be grilled, baked, or boiled. Avoid fried foods. Focus on lean protein sources like fish or chicken with vegetables and fruits. The American Heart Association is a Chief Technology Officer!  American  Heart Association Diet and Lifeystyle Recommendations   Exercise recommendations: The American Heart Association recommends 150 minutes of moderate intensity exercise weekly. Try 30 minutes of moderate intensity exercise 4-5 times per week. This could include walking, jogging, or swimming.   Important Information About Sugar

## 2021-11-02 NOTE — Progress Notes (Signed)
Office Visit    Patient Name: Madeline Morris Date of Encounter: 11/05/2021  PCP:  Renford Dills, MD   [facility]  Cardiologist:  Chilton Si, MD  Advanced Practice Provider:  No care team member to display Electrophysiologist:  None      Chief Complaint    Madeline Morris is a 61 y.o. 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   Bacterial infection    Chlamydia    Dense breast    Dyspareunia in female    Endometriosis    Herpes    Herpes simplex    High blood pressure    History of chicken pox    History of measles    History of mumps    Hypertensive disorder    PONV (postoperative nausea and vomiting)    Yeast infection    Past Surgical History:  Procedure Laterality Date   ABDOMINAL HYSTERECTOMY     BREAST BIOPSY Left    x3   BREAST CYST ASPIRATION     BREAST EXCISIONAL BIOPSY Left 11/2018   BREAST EXCISIONAL BIOPSY Left    many years ago   BREAST EXCISIONAL BIOPSY Right    BREAST LUMPECTOMY WITH RADIOACTIVE SEED LOCALIZATION Left 11/22/2018   Procedure: LEFT BREAST LUMPECTOMY WITH RADIOACTIVE SEED LOCALIZATION;  Surgeon: Claud Kelp, MD;  Location: [facility];  Service: General;  Laterality: Left;   LAPAROSCOPY     MYOMECTOMY     TONSILECTOMY, ADENOIDECTOMY, BILATERAL MYRINGOTOMY AND TUBES     VESICOVAGINAL FISTULA CLOSURE W/ TAH     WISDOM TOOTH EXTRACTION      Allergies  No Known Allergies  History of Present Illness    Madeline Morris is a 61 y.o. female with a hx of hypertension, hyperlipidemia last seen 09/05/21  She was initially seen by Dr. Duke Salvia July 2018 for hypertension and risk assessment prior to starting HRT.  Prior carotid Dopplers 4/15 with mild bilateral intimal thickening with no plaque.  Her  losartan was increased at that time.  She was referred for exercise tolerance test due to exertional dyspnea with baseline EKG changes though no horizontal ST depression and was able to obtain 11.7 METS on Bruce protocol.  She was referred for coronary CTA with coronary calcium score of 0.    She was seen via virtual visit 08/2019 doing overall well.  Her blood pressure was well controlled.  She had stopped hormone therapy due to breast cancer scare.  Last seen in clinic 09/05/2020 due to chief complaint of palpitations. Blood work including BMP, TSH, CBC, magnesium unremarkable.  EKG showed normal sinus rhythm with occasional PAC.ZIO monitor 09/2021 revealed predominantly normal sinus rhythm with PAC burden 6.2%.  She was started on Toprol 25 mg daily.  She presents today for follow-up. Notes she did not yet start Metoprolol as shortly after monitor results [place] wrist, and had to have surgery with 12 pins and a plate. She was concerned about Metoprolol along with her pain medication. Recently transitioned from cast to brace and is no longer requiring more than an occasional NSAID for pain. Her palpitations have not been bothersome since her last clinical visit. Has reduced her caffeine intake. Reports no shortness of breath nor  dyspnea on exertion. Reports no chest pain, pressure, or tightness. No edema, orthopnea, PND. Reports no palpitations.    EKGs/Labs/Other Studies Reviewed:   The following studies were reviewed today:  ZIO monitor 09/26/21 Frequent PACs (6.2%) Atrial bigeminy Rare PVCs Up to 10 second runs of SVT  ETT 01/04/2017 Blood pressure demonstrated a hypertensive response to exercise. Horizontal ST segment depression ST segment depression of 2 mm was noted during stress in the II, III, V5, V6 and aVF leads. T wave inversion was noted during stress.   Baseline ECG shows LVH and prominent ST depression, much more obvious compared to previous  ECG on file. There is no additional ST segment depression during exercise. Good exercise capacity. Study specificity is limited by the presence of baseline ST segment abnormalities. Consider imaging studies if there is high suspicion for CAD.   Coronary CTA 01/26/2017 IMPRESSION: 1) Calcium score 0   2) Normal right dominant coronary arteries   3) Normal aortic root 3.5 cm  EKG:  No EKG today.   Recent Labs: 09/05/2021: BUN 17; Creatinine, Ser 0.73; Hemoglobin 14.5; Magnesium 2.3; Platelets 313; Potassium 4.1; Sodium 135; TSH 2.220  Recent Lipid Panel No results found for: CHOL, TRIG, HDL, CHOLHDL, VLDL, LDLCALC, LDLDIRECT   Home Medications   Current Meds  Medication Sig   losartan (COZAAR) 100 MG tablet Take 1 tablet (100 mg total) by mouth daily.   meloxicam (MOBIC) 15 MG tablet Take 1 tablet (15 mg total) by mouth daily.   rosuvastatin (CRESTOR) 5 MG tablet Take 5 mg by mouth daily.   triamterene-hydrochlorothiazide (MAXZIDE-25) 37.5-25 MG tablet Take 1 tablet by mouth daily.      Review of Systems      All other systems reviewed and are otherwise negative except as noted above.  Physical Exam    VS:  BP 122/70   Pulse 73   Ht 5\' 7"  (1.702 m)   Wt 145 lb 1.6 oz (65.8 kg)   BMI 22.73 kg/m  , BMI Body mass index is 22.73 kg/m.  Wt Readings from Last 3 Encounters:  11/02/21 145 lb 1.6 oz (65.8 kg)  09/05/21 147 lb (66.7 kg)  08/26/19 135 lb (61.2 kg)    GEN: Well nourished, well developed, in no acute distress. HEENT: normal. Neck: Supple, no JVD, carotid bruits, or masses. Cardiac: RRR, no murmurs, rubs, or gallops. No clubbing, cyanosis, edema.  Radials/PT 2+ and equal bilaterally.  Respiratory:  Respirations regular and unlabored, clear to auscultation bilaterally. GI: Soft, nontender, nondistended. MS: No deformity or atrophy. Skin: Warm and dry, no rash. Neuro:  Strength and sensation are intact. Psych: Normal affect.  Assessment & Plan     Palpitations / PAC -monitor 09/26/2021 revealing PAC burden 6.2%.  She was recommended Toprol 25 mg daily but did not start as her palpitations had decreased and she was recuperating from breaking her wrist. As palpitations are quiescent with only reducing caffeine intake, will transition Toprol to PRN only for palpitations.   HTN - BP well controlled. Continue current antihypertensive regimen.    HLD - Continue Crestor 5mg  QD.  Disposition: Follow up in 1 year with 09/28/2021, MD or APP.  Signed, , NP 11/02/2021, 3:08 PM [facility]

## 2021-11-04 ENCOUNTER — Other Ambulatory Visit: Payer: Self-pay | Admitting: Obstetrics and Gynecology

## 2021-11-04 DIAGNOSIS — Z1231 Encounter for screening mammogram for malignant neoplasm of breast: Secondary | ICD-10-CM

## 2021-11-14 ENCOUNTER — Ambulatory Visit: Payer: BC Managed Care – PPO

## 2021-11-15 DIAGNOSIS — M25632 Stiffness of left wrist, not elsewhere classified: Secondary | ICD-10-CM | POA: Diagnosis not present

## 2021-11-22 ENCOUNTER — Ambulatory Visit
Admission: RE | Admit: 2021-11-22 | Discharge: 2021-11-22 | Disposition: A | Discharge status: Home / Self Care | Payer: BC Managed Care – PPO | Source: Ambulatory Visit | Attending: Obstetrics and Gynecology | Admitting: Obstetrics and Gynecology

## 2021-11-22 DIAGNOSIS — Z1231 Encounter for screening mammogram for malignant neoplasm of breast: Secondary | ICD-10-CM | POA: Diagnosis not present

## 2021-11-22 DIAGNOSIS — M25632 Stiffness of left wrist, not elsewhere classified: Secondary | ICD-10-CM | POA: Diagnosis not present

## 2021-11-22 DIAGNOSIS — S52532D Colles' fracture of left radius, subsequent encounter for closed fracture with routine healing: Secondary | ICD-10-CM | POA: Diagnosis not present

## 2021-11-23 DIAGNOSIS — L821 Other seborrheic keratosis: Secondary | ICD-10-CM | POA: Diagnosis not present

## 2021-11-23 DIAGNOSIS — L814 Other melanin hyperpigmentation: Secondary | ICD-10-CM | POA: Diagnosis not present

## 2021-11-23 DIAGNOSIS — L578 Other skin changes due to chronic exposure to nonionizing radiation: Secondary | ICD-10-CM | POA: Diagnosis not present

## 2021-11-23 DIAGNOSIS — D225 Melanocytic nevi of trunk: Secondary | ICD-10-CM | POA: Diagnosis not present

## 2021-12-01 DIAGNOSIS — M25632 Stiffness of left wrist, not elsewhere classified: Secondary | ICD-10-CM | POA: Diagnosis not present

## 2021-12-07 DIAGNOSIS — M25632 Stiffness of left wrist, not elsewhere classified: Secondary | ICD-10-CM | POA: Diagnosis not present

## 2021-12-20 DIAGNOSIS — S52532D Colles' fracture of left radius, subsequent encounter for closed fracture with routine healing: Secondary | ICD-10-CM | POA: Diagnosis not present

## 2021-12-20 DIAGNOSIS — Z4789 Encounter for other orthopedic aftercare: Secondary | ICD-10-CM | POA: Diagnosis not present

## 2021-12-20 DIAGNOSIS — M25632 Stiffness of left wrist, not elsewhere classified: Secondary | ICD-10-CM | POA: Diagnosis not present

## 2021-12-22 DIAGNOSIS — F324 Major depressive disorder, single episode, in partial remission: Secondary | ICD-10-CM | POA: Diagnosis not present

## 2021-12-22 DIAGNOSIS — Z0000 Encounter for general adult medical examination without abnormal findings: Secondary | ICD-10-CM | POA: Diagnosis not present

## 2021-12-22 DIAGNOSIS — I10 Essential (primary) hypertension: Secondary | ICD-10-CM | POA: Diagnosis not present

## 2021-12-22 DIAGNOSIS — E7800 Pure hypercholesterolemia, unspecified: Secondary | ICD-10-CM | POA: Diagnosis not present

## 2022-04-11 ENCOUNTER — Ambulatory Visit
Admission: RE | Admit: 2022-04-11 | Discharge: 2022-04-11 | Disposition: A | Discharge status: Home / Self Care | Payer: BC Managed Care – PPO | Source: Ambulatory Visit | Attending: Internal Medicine | Admitting: Internal Medicine

## 2022-04-11 ENCOUNTER — Other Ambulatory Visit: Payer: Self-pay | Admitting: Internal Medicine

## 2022-04-11 DIAGNOSIS — R0789 Other chest pain: Secondary | ICD-10-CM

## 2022-04-11 DIAGNOSIS — R921 Mammographic calcification found on diagnostic imaging of breast: Secondary | ICD-10-CM | POA: Diagnosis not present

## 2022-04-11 DIAGNOSIS — R0781 Pleurodynia: Secondary | ICD-10-CM | POA: Diagnosis not present

## 2022-04-11 DIAGNOSIS — Z23 Encounter for immunization: Secondary | ICD-10-CM | POA: Diagnosis not present

## 2022-07-04 DIAGNOSIS — L821 Other seborrheic keratosis: Secondary | ICD-10-CM | POA: Diagnosis not present

## 2022-07-04 DIAGNOSIS — D225 Melanocytic nevi of trunk: Secondary | ICD-10-CM | POA: Diagnosis not present

## 2022-07-04 DIAGNOSIS — L578 Other skin changes due to chronic exposure to nonionizing radiation: Secondary | ICD-10-CM | POA: Diagnosis not present

## 2022-07-04 DIAGNOSIS — L814 Other melanin hyperpigmentation: Secondary | ICD-10-CM | POA: Diagnosis not present

## 2022-07-04 DIAGNOSIS — L570 Actinic keratosis: Secondary | ICD-10-CM | POA: Diagnosis not present

## 2022-12-27 ENCOUNTER — Other Ambulatory Visit: Payer: Self-pay | Admitting: Internal Medicine

## 2022-12-27 DIAGNOSIS — Z1231 Encounter for screening mammogram for malignant neoplasm of breast: Secondary | ICD-10-CM

## 2023-01-18 ENCOUNTER — Ambulatory Visit: Payer: BC Managed Care – PPO

## 2023-01-23 ENCOUNTER — Ambulatory Visit
Admission: RE | Admit: 2023-01-23 | Discharge: 2023-01-23 | Disposition: A | Discharge status: Home / Self Care | Payer: BC Managed Care – PPO | Source: Ambulatory Visit | Attending: Internal Medicine | Admitting: Internal Medicine

## 2023-01-23 DIAGNOSIS — Z1231 Encounter for screening mammogram for malignant neoplasm of breast: Secondary | ICD-10-CM | POA: Diagnosis not present

## 2023-02-01 DIAGNOSIS — D225 Melanocytic nevi of trunk: Secondary | ICD-10-CM | POA: Diagnosis not present

## 2023-02-01 DIAGNOSIS — L814 Other melanin hyperpigmentation: Secondary | ICD-10-CM | POA: Diagnosis not present

## 2023-02-01 DIAGNOSIS — L578 Other skin changes due to chronic exposure to nonionizing radiation: Secondary | ICD-10-CM | POA: Diagnosis not present

## 2023-02-01 DIAGNOSIS — L821 Other seborrheic keratosis: Secondary | ICD-10-CM | POA: Diagnosis not present

## 2023-02-13 DIAGNOSIS — I10 Essential (primary) hypertension: Secondary | ICD-10-CM | POA: Diagnosis not present

## 2023-02-13 DIAGNOSIS — E7800 Pure hypercholesterolemia, unspecified: Secondary | ICD-10-CM | POA: Diagnosis not present

## 2023-08-11 DIAGNOSIS — B029 Zoster without complications: Secondary | ICD-10-CM | POA: Diagnosis not present

## 2023-08-13 DIAGNOSIS — L821 Other seborrheic keratosis: Secondary | ICD-10-CM | POA: Diagnosis not present

## 2023-08-13 DIAGNOSIS — D225 Melanocytic nevi of trunk: Secondary | ICD-10-CM | POA: Diagnosis not present

## 2023-08-13 DIAGNOSIS — L814 Other melanin hyperpigmentation: Secondary | ICD-10-CM | POA: Diagnosis not present

## 2023-08-13 DIAGNOSIS — L578 Other skin changes due to chronic exposure to nonionizing radiation: Secondary | ICD-10-CM | POA: Diagnosis not present

## 2023-09-19 DIAGNOSIS — Z01419 Encounter for gynecological examination (general) (routine) without abnormal findings: Secondary | ICD-10-CM | POA: Diagnosis not present

## 2023-09-19 DIAGNOSIS — Z1339 Encounter for screening examination for other mental health and behavioral disorders: Secondary | ICD-10-CM | POA: Diagnosis not present

## 2024-02-19 DIAGNOSIS — L570 Actinic keratosis: Secondary | ICD-10-CM | POA: Diagnosis not present

## 2024-02-19 DIAGNOSIS — L814 Other melanin hyperpigmentation: Secondary | ICD-10-CM | POA: Diagnosis not present

## 2024-02-19 DIAGNOSIS — L821 Other seborrheic keratosis: Secondary | ICD-10-CM | POA: Diagnosis not present

## 2024-02-19 DIAGNOSIS — D225 Melanocytic nevi of trunk: Secondary | ICD-10-CM | POA: Diagnosis not present

## 2024-02-19 DIAGNOSIS — L578 Other skin changes due to chronic exposure to nonionizing radiation: Secondary | ICD-10-CM | POA: Diagnosis not present

## 2024-03-13 ENCOUNTER — Other Ambulatory Visit: Payer: Self-pay | Admitting: Internal Medicine

## 2024-03-13 DIAGNOSIS — Z1231 Encounter for screening mammogram for malignant neoplasm of breast: Secondary | ICD-10-CM

## 2024-04-09 ENCOUNTER — Ambulatory Visit
Admission: RE | Admit: 2024-04-09 | Discharge: 2024-04-09 | Disposition: A | Discharge status: Home / Self Care | Source: Ambulatory Visit | Attending: Internal Medicine | Admitting: Internal Medicine

## 2024-04-09 DIAGNOSIS — Z23 Encounter for immunization: Secondary | ICD-10-CM | POA: Diagnosis not present

## 2024-04-09 DIAGNOSIS — Z1231 Encounter for screening mammogram for malignant neoplasm of breast: Secondary | ICD-10-CM | POA: Diagnosis not present

## 2024-04-09 DIAGNOSIS — E7800 Pure hypercholesterolemia, unspecified: Secondary | ICD-10-CM | POA: Diagnosis not present

## 2024-04-09 DIAGNOSIS — F324 Major depressive disorder, single episode, in partial remission: Secondary | ICD-10-CM | POA: Diagnosis not present

## 2024-04-09 DIAGNOSIS — Z0000 Encounter for general adult medical examination without abnormal findings: Secondary | ICD-10-CM | POA: Diagnosis not present

## 2024-04-09 DIAGNOSIS — C439 Malignant melanoma of skin, unspecified: Secondary | ICD-10-CM | POA: Diagnosis not present
